# Patient Record
Sex: Female | Born: 1965
Health system: Southern US, Community
[De-identification: ages and names within clinical notes are randomized; demographics above are authoritative.]

## PROBLEM LIST (undated history)

## (undated) DIAGNOSIS — R7989 Other specified abnormal findings of blood chemistry: Secondary | ICD-10-CM

## (undated) DIAGNOSIS — F419 Anxiety disorder, unspecified: Secondary | ICD-10-CM

## (undated) DIAGNOSIS — E785 Hyperlipidemia, unspecified: Secondary | ICD-10-CM

## (undated) HISTORY — DX: Other specified abnormal findings of blood chemistry: R79.89

## (undated) HISTORY — DX: Anxiety disorder, unspecified: F41.9

## (undated) HISTORY — PX: BREAST ENHANCEMENT SURGERY: SHX7

## (undated) HISTORY — DX: Hyperlipidemia, unspecified: E78.5

---

## 2002-12-31 ENCOUNTER — Other Ambulatory Visit: Admission: RE | Admit: 2002-12-31 | Discharge: 2002-12-31 | Payer: Self-pay | Admitting: Obstetrics and Gynecology

## 2004-02-08 ENCOUNTER — Other Ambulatory Visit: Admission: RE | Admit: 2004-02-08 | Discharge: 2004-02-08 | Payer: Self-pay | Admitting: Obstetrics and Gynecology

## 2004-04-18 ENCOUNTER — Ambulatory Visit (HOSPITAL_BASED_OUTPATIENT_CLINIC_OR_DEPARTMENT_OTHER): Admission: RE | Admit: 2004-04-18 | Discharge: 2004-04-18 | Payer: Self-pay | Admitting: Urology

## 2004-04-18 ENCOUNTER — Ambulatory Visit (HOSPITAL_COMMUNITY): Admission: RE | Admit: 2004-04-18 | Discharge: 2004-04-18 | Payer: Self-pay | Admitting: Urology

## 2005-04-03 ENCOUNTER — Other Ambulatory Visit: Admission: RE | Admit: 2005-04-03 | Discharge: 2005-04-03 | Payer: Self-pay | Admitting: Obstetrics and Gynecology

## 2013-05-15 ENCOUNTER — Encounter: Payer: Self-pay | Admitting: Internal Medicine

## 2013-05-15 ENCOUNTER — Ambulatory Visit (INDEPENDENT_AMBULATORY_CARE_PROVIDER_SITE_OTHER): Payer: 59 | Admitting: Internal Medicine

## 2013-05-15 VITALS — BP 118/62 | HR 69 | Ht 62.0 in | Wt 118.0 lb

## 2013-05-15 DIAGNOSIS — G4700 Insomnia, unspecified: Secondary | ICD-10-CM

## 2013-05-15 DIAGNOSIS — F5104 Psychophysiologic insomnia: Secondary | ICD-10-CM

## 2013-05-15 MED ORDER — TEMAZEPAM 15 MG PO CAPS: 15.0000 mg | ORAL_CAPSULE | Freq: Every evening | ORAL | Status: DC | PRN

## 2013-05-15 NOTE — Progress Notes (Signed)
05/15/13- 17 yoF never smoker from [place] 24 years ago. Self referral-having trouble sleeping. Always difficulty initiating and maintaining sleep. Can't sleep comfortably with husband-sleeps better and her own bedroom. She moves around a lot. Occasional snore. Avoids all caffeine and naps. Gets home from work about 8 PM. Uses amitriptyline for bladder but afraid of dependence so cuts the pill in fractions. Frequent nocturia. Bedtime 11 PM until 6:30 AM with sleep latency 30-60 minutes. Waking 45 times during the night before up at 7 AM. Rarely uses Xanax. Works as a Sports administrator.  Prior to Admission medications   Medication Sig Start Date End Date Taking? Authorizing Provider  ALPRAZolam Prudy Feeler) 0.25 MG tablet Take 0.25 mg by mouth daily as needed for anxiety.   Yes Historical Provider, MD  amitriptyline (ELAVIL) 25 MG tablet Take 25 mg by mouth at bedtime.   Yes Historical Provider, MD  temazepam (RESTORIL) 15 MG capsule Take 1 capsule (15 mg total) by mouth at bedtime as needed for sleep. 05/15/13   Waymon Budge, MD   History reviewed. No pertinent past medical history. Past Surgical History  Procedure Laterality Date  . Breast enhancement surgery Bilateral    Family History  Problem Relation Age of Onset  . Anemia Father    History   Social History  . Marital Status: Married    Spouse Name: N/A    Number of Children: 2  . Years of Education: N/A   Occupational History  . tailor seamtress    Social History Main Topics  . Smoking status: Never Smoker   . Smokeless tobacco: Not on file  . Alcohol Use: No  . Drug Use: No  . Sexual Activity: Not on file   Other Topics Concern  . Not on file   Social History Narrative  . No narrative on file   ROS-see HPI Constitutional:   No-   weight loss, night sweats, fevers, chills, fatigue, lassitude. HEENT:   No-  headaches, difficulty swallowing, tooth/dental problems, sore throat,       No-  sneezing, itching, ear ache,  nasal congestion, post nasal drip,  CV:  No-   chest pain, orthopnea, PND, swelling in lower extremities, anasarca,  dizziness, palpitations Resp: No-   shortness of breath with exertion or at rest.              No-   productive cough,  No non-productive cough,  No- coughing up of blood.              No-   change in color of mucus.  No- wheezing.   Skin: No-   rash or lesions. GI:  No-   heartburn, indigestion, abdominal pain, nausea, vomiting, diarrhea,                 change in bowel habits, loss of appetite GU: No-   dysuria, change in color of urine, no urgency or frequency.  No- flank pain. MS:  No-   joint pain or swelling.  No- decreased range of motion.  No- back pain. Neuro-     nothing unusual Psych:  No- change in mood or affect. No depression or anxiety.  No memory loss.  OBJ- Physical Exam General- Alert, Oriented, Affect-appropriate, Distress- none acute Skin- rash-none, lesions- none, excoriation- none Lymphadenopathy- none Head- atraumatic            Eyes- Gross vision intact, PERRLA, conjunctivae and secretions clear  Ears- Hearing, canals-normal            Nose- Clear, no-Septal dev, mucus, polyps, erosion, perforation             Throat- Mallampati II-III , mucosa clear , drainage- none, tonsils- atrophic Neck- flexible , trachea midline, no stridor , thyroid nl, carotid no bruit Chest - symmetrical excursion , unlabored           Heart/CV- RRR , no murmur , no gallop  , no rub, nl s1 s2                           - JVD- none , edema- none, stasis changes- none, varices- none           Lung- clear to P&A, wheeze- none, cough- none , dullness-none, rub- none           Chest wall-  Abd- tender-no, distended-no, bowel sounds-present, HSM- no Br/ Gen/ Rectal- Not done, not indicated Extrem- cyanosis- none, clubbing, none, atrophy- none, strength- nl Neuro- grossly intact to observation

## 2013-05-15 NOTE — Patient Instructions (Signed)
Try adding temazepam 1 at bedtime as needed for sleep  Continue amitriptyline at the dose prescribed by your bladder doctor  Please call back as needed

## 2013-05-31 ENCOUNTER — Encounter: Payer: Self-pay | Admitting: Internal Medicine

## 2013-05-31 DIAGNOSIS — F5104 Psychophysiologic insomnia: Secondary | ICD-10-CM | POA: Insufficient documentation

## 2013-05-31 NOTE — Assessment & Plan Note (Addendum)
Social stresses and urinary urgency causing nocturia both contribute to her insomnia. Plan-temazepam 15 mg. Also take amitriptyline at full prescribed dose. If bladder is no better she is to go back to her urologist

## 2013-07-03 ENCOUNTER — Encounter: Payer: Self-pay | Admitting: Gynecology

## 2013-07-06 ENCOUNTER — Ambulatory Visit (INDEPENDENT_AMBULATORY_CARE_PROVIDER_SITE_OTHER): Payer: 59 | Admitting: Internal Medicine

## 2013-07-06 ENCOUNTER — Encounter: Payer: Self-pay | Admitting: Internal Medicine

## 2013-07-06 VITALS — BP 112/66 | HR 95 | Ht 62.0 in | Wt 122.0 lb

## 2013-07-06 DIAGNOSIS — G4700 Insomnia, unspecified: Secondary | ICD-10-CM

## 2013-07-06 DIAGNOSIS — F5104 Psychophysiologic insomnia: Secondary | ICD-10-CM

## 2013-07-06 MED ORDER — TEMAZEPAM 15 MG PO CAPS: 15.0000 mg | ORAL_CAPSULE | Freq: Every evening | ORAL | Status: DC | PRN

## 2013-07-06 NOTE — Patient Instructions (Signed)
We can see you again in a year, unless you need help sooner. Please call as needed.  Script refilling temazepam for sleep. You can callus to refill this when it runs out, to last until your next appointment.

## 2013-07-06 NOTE — Progress Notes (Signed)
05/15/13- [age] yoF never smoker from [place] 24 years ago. Self referral-having trouble sleeping. Always difficulty initiating and maintaining sleep. Can't sleep comfortably with husband-sleeps better and her own bedroom. She moves around a lot. Occasional snore. Avoids all caffeine and naps. Gets home from work about 8 PM. Uses amitriptyline for bladder but afraid of dependence so cuts the pill in fractions. Frequent nocturia. Bedtime 11 PM until 6:30 AM with sleep latency 30-60 minutes. Waking 45 times during the night before up at 7 AM. Rarely uses Xanax. Works as a Sports administratortailor/seamstress.  07/06/13- [age] yoF never smoker from [place] 24 years ago. Self referral-having trouble sleeping. FOLLOWS FOR: Pt states that the Temazepam Rx helps when she takes it; she states is scared to take all the time. Using temazepam every 3 or 4 nights. Without it she only sleeps 2 or 3 hours. Amitriptyline now taking whole pill as prescribed. She admits "busy brain"-just can't let go of the issues of the day.  ROS-see HPI Constitutional:   No-   weight loss, night sweats, fevers, chills,+fatigue, lassitude. HEENT:   No-  headaches, difficulty swallowing, tooth/dental problems, sore throat,       No-  sneezing, itching, ear ache, nasal congestion, post nasal drip,  CV:  No-   chest pain, orthopnea, PND, swelling in lower extremities, anasarca,  dizziness, palpitations Resp: No-   shortness of breath with exertion or at rest.              No-   productive cough,  No non-productive cough,  No- coughing up of blood.              No-   change in color of mucus.  No- wheezing.   Skin: No-   rash or lesions. GI:  No-   heartburn, indigestion, abdominal pain, nausea, vomiting,  GU:  MS:  No-   joint pain or swelling.   Neuro-     nothing unusual Psych:  No- change in mood or affect. No depression or anxiety.  No memory loss.  OBJ- Physical Exam General- Alert, Oriented, Affect-appropriate, Distress- none acute Skin-  rash-none, lesions- none, excoriation- none Lymphadenopathy- none Head- atraumatic            Eyes- Gross vision intact, PERRLA, conjunctivae and secretions clear            Ears- Hearing, canals-normal            Nose- Clear, no-Septal dev, mucus, polyps, erosion, perforation             Throat- Mallampati II-III , mucosa clear , drainage- none, tonsils- atrophic Neck- flexible , trachea midline, no stridor , thyroid nl, carotid no bruit Chest - symmetrical excursion , unlabored           Heart/CV- RRR , no murmur , no gallop  , no rub, nl s1 s2                           - JVD- none , edema- none, stasis changes- none, varices- none           Lung- clear to P&A, wheeze- none, cough- none , dullness-none, rub- none           Chest wall-  Abd-  Br/ Gen/ Rectal- Not done, not indicated Extrem- cyanosis- none, clubbing, none, atrophy- none, strength- nl Neuro- grossly intact to observation

## 2013-07-29 NOTE — Assessment & Plan Note (Signed)
Nonspecific chronic insomnia. There is probably a stress component and maybe some depression but the pattern is long-standing. We have talked about the possibility of cognitive behavioral therapy. For now she is doing very well with temazepam and amitriptyline, which can be continued. Medication talked done

## 2013-08-07 ENCOUNTER — Encounter: Payer: Self-pay | Admitting: Gynecology

## 2013-08-07 ENCOUNTER — Ambulatory Visit (INDEPENDENT_AMBULATORY_CARE_PROVIDER_SITE_OTHER): Payer: 59 | Admitting: Gynecology

## 2013-08-07 VITALS — BP 120/82 | HR 83 | Resp 14 | Ht 61.75 in | Wt 121.0 lb

## 2013-08-07 DIAGNOSIS — N852 Hypertrophy of uterus: Secondary | ICD-10-CM

## 2013-08-07 DIAGNOSIS — Z0000 Encounter for general adult medical examination without abnormal findings: Secondary | ICD-10-CM

## 2013-08-07 DIAGNOSIS — Z01419 Encounter for gynecological examination (general) (routine) without abnormal findings: Secondary | ICD-10-CM

## 2013-08-07 DIAGNOSIS — Z124 Encounter for screening for malignant neoplasm of cervix: Secondary | ICD-10-CM

## 2013-08-07 LAB — POCT URINALYSIS DIPSTICK
RBC UA: 2
Urobilinogen, UA: NEGATIVE
pH, UA: 5

## 2013-08-07 NOTE — Progress Notes (Signed)
48 y.o. Married Asian female   G2P2002 here for annual exam. Pt is currently sexually active.  Pt reports that her cycles were irregluar but now have been normal for the last few months.  Pt is having issues with her bladder, she will get up multiple times at night, also seems to be affected by food-spicy and acidic.  Was evaluated by urology and was given Elmiron. Pt is on elavil for 6-7y, every night and now has some memory issues.  Pt was evaluated for sleep she has not been able to come off the medications.  Pt denies fibroids.  No hot flashes, night sweats.  No dyspareunia  Patient's last menstrual period was 07/29/2013.          Sexually active: yes  The current method of family planning is none.    Exercising: yes  pretty active at work Last pap: 4-5 years Alcohol: no  Tobacco: no BSE: yes Mammogram: 3 years ago Normal  Labs drawn ; Urine: Leuks 2, Blood 2     Health Maintenance  Topic Date Due  . Pap Smear  12/18/1983  . Tetanus/tdap  12/17/1984  . Influenza Vaccine  07/06/2014 (Originally 01/23/2013)    Family History  Problem Relation Age of Onset  . Anemia Father     Patient Active Problem List   Diagnosis Date Noted  . Chronic insomnia 05/31/2013    Past Medical History  Diagnosis Date  . Anxiety     Past Surgical History  Procedure Laterality Date  . Breast enhancement surgery Bilateral 10 years ago    Breast Lift    Allergies: Review of patient's allergies indicates no known allergies.  Current Outpatient Prescriptions  Medication Sig Dispense Refill  . ALPRAZolam (XANAX) 0.25 MG tablet Take 0.25 mg by mouth daily as needed for anxiety.      Marland Kitchen amitriptyline (ELAVIL) 25 MG tablet Take 25 mg by mouth at bedtime.      . temazepam (RESTORIL) 15 MG capsule Take 1 capsule (15 mg total) by mouth at bedtime as needed for sleep.  30 capsule  5   No current facility-administered medications for this visit.    ROS: Pertinent items are noted in HPI.  Exam:     BP 120/82  Pulse 83  Resp 14  Ht 5' 1.75" (1.568 m)  Wt 121 lb (54.885 kg)  BMI 22.32 kg/m2  LMP 07/29/2013 Weight change: @WEIGHTCHANGE @ Last 3 height recordings:  Ht Readings from Last 3 Encounters:  08/07/13 5' 1.75" (1.568 m)  07/06/13 5\' 2"  (1.575 m)  05/15/13 5\' 2"  (1.575 m)   General appearance: alert, cooperative and appears stated age Head: Normocephalic, without obvious abnormality, atraumatic Neck: no adenopathy, no carotid bruit, no JVD, supple, symmetrical, trachea midline and thyroid not enlarged, symmetric, no tenderness/mass/nodules Lungs: clear to auscultation bilaterally Breasts: normal appearance, no masses or tenderness Heart: regular rate and rhythm, S1, S2 normal, no murmur, click, rub or gallop Abdomen: soft, non-tender; bowel sounds normal; no masses,  no organomegaly Extremities: extremities normal, atraumatic, no cyanosis or edema Skin: Skin color, texture, turgor normal. No rashes or lesions Lymph nodes: Cervical, supraclavicular, and axillary nodes normal. no inguinal nodes palpated Neurologic: Grossly normal   Pelvic: External genitalia:  no lesions              Urethra: normal appearing urethra with no masses, tenderness or lesions              Bartholins and Skenes: normal  Vagina: normal appearing vagina with normal color and discharge, no lesions              Cervix: normal appearance and friable              Pap taken: yes        Bimanual Exam:  Uterus:  enlarged to 10 week's size, irregular                                      Adnexa:    no masses                                      Rectovaginal: Confirms                                      Anus:  normal sphincter tone, no lesions  A: well woman Contraceptive management Enlarged uterus     P: mammogram pap smear with HrHPV PUS to evaluate uterus and adnexa counseled on breast self exam, mammography screening, adequate intake of calcium and vitamin D, diet and  exercise return annually or prn   An After Visit Summary was printed and given to the patient.

## 2013-08-07 NOTE — Patient Instructions (Signed)

## 2013-08-12 ENCOUNTER — Telehealth: Payer: Self-pay | Admitting: Gynecology

## 2013-08-12 NOTE — Telephone Encounter (Signed)
Left message to call back. Need to go over benefits and schedule PUS °

## 2013-08-13 NOTE — Telephone Encounter (Signed)
Patients husband called back stating that his wife would like to have PUS performed at [facility] [place].

## 2013-08-13 NOTE — Telephone Encounter (Signed)
Returned call to patient. She asked that I call her husband, Gershon Mussel, to go over benefit information. I called Tom at [phone] advising that $400 calendar year deductible has not been met per their insurance company. Also advised that patient liability for the PUS will be $302.99. Gershon Mussel will contact the insurance company and call back.

## 2013-08-13 NOTE — Telephone Encounter (Signed)
Patient returning Sabrina's call. °

## 2013-08-13 NOTE — Telephone Encounter (Signed)
Spouse called back. Agreeable with [facility] for PUS. Patient will call back to schedule.

## 2013-08-14 LAB — IPS PAP TEST WITH HPV

## 2013-08-25 ENCOUNTER — Telehealth: Payer: Self-pay | Admitting: Gynecology

## 2013-08-25 NOTE — Telephone Encounter (Signed)
Patient is scheduled to have this ultrasound at [facility] 03.06.2015 @ 9am. Patient aware. Patient also aware to that she needs f/u appt w/ Dr Farrel GobbleLathrop. Patient is going to call [facility]. She may change her appointment date and time.

## 2013-08-25 NOTE — Telephone Encounter (Signed)
Message copied by Vangie BickerFRANKLIN, SABRINA S on Tue Aug 25, 2013 11:36 AM ------      Message from: Douglass RiversLATHROP, TRACY      Created: Fri Aug 14, 2013  2:40 PM       [facility] she will need a f/u appt to discuss about 1w afterwards      TL      ----- Message -----         From: Vangie BickerSabrina S [place]         Sent: 08/13/2013  12:19 PM           To: Armen PickupSarah S Yeakley, RN, Bennye Almracy H Lathrop, MD            Dr. Farrel GobbleLathrop,            Is it okay that the patient have her ultrasound performed at [facility] in [place]. Per patients spouse, they cannot scheduled on a Tuesday to come here. Patient needs to schedule on a Monday or a Friday.            Thanks,      Martie LeeSabrina       ------

## 2013-08-31 ENCOUNTER — Telehealth: Payer: Self-pay | Admitting: Gynecology

## 2013-08-31 NOTE — Telephone Encounter (Signed)
Patient has some questions for nurse about scheduling an appointment since she has had an ultrasound. She is not sure when exactly she should come in for a recheck.

## 2013-08-31 NOTE — Telephone Encounter (Signed)
Patient has order for PUS to check for fibroids since enlarged uterus on pelvic exam. Can you check on this?

## 2013-09-01 NOTE — Telephone Encounter (Signed)
Kennon RoundsSally, This is the patient that had PUS at [facility]. She was advised that she would need follow up appointment after she had that ultrasound. Should she be scheduled 1 week post PUS? If so, I will call and schedule her.  Thanks, Martie LeeSabrina

## 2013-09-02 NOTE — Telephone Encounter (Signed)
Routing to Sally. 

## 2013-09-02 NOTE — Telephone Encounter (Signed)
Yes, 1 week should be fine.  Thank you for scheduling. Can route to Dr Farrel GobbleLathrop and close encounter once scheduled.

## 2013-09-04 NOTE — Telephone Encounter (Signed)
Called patient. Scheduled visit

## 2013-09-14 ENCOUNTER — Ambulatory Visit (INDEPENDENT_AMBULATORY_CARE_PROVIDER_SITE_OTHER): Payer: 59 | Admitting: Gynecology

## 2013-09-14 VITALS — BP 110/80 | HR 76 | Resp 14 | Ht 61.75 in | Wt 121.0 lb

## 2013-09-14 DIAGNOSIS — N852 Hypertrophy of uterus: Secondary | ICD-10-CM

## 2013-09-15 ENCOUNTER — Encounter: Payer: Self-pay | Admitting: Gynecology

## 2013-09-15 NOTE — Progress Notes (Signed)
Pt presents to discuss her u/s that was done for enlarged uterus noted at [facility].  The report was reviewed, images not available-uterus is upper normal at 8.6x5.2x6.4cm but no fibroids were noted.  In addition, her ovaries were reported as normal, EMS 7mm, no free fluid noted. Pt assured.  Most likely enlargement is due to adenomyosis.  Discussed impact, pt is not having any issues regarding bleeding or cramping.

## 2013-09-24 ENCOUNTER — Ambulatory Visit (INDEPENDENT_AMBULATORY_CARE_PROVIDER_SITE_OTHER): Payer: 59 | Admitting: Internal Medicine

## 2013-09-24 ENCOUNTER — Encounter: Payer: Self-pay | Admitting: Internal Medicine

## 2013-09-24 ENCOUNTER — Ambulatory Visit: Payer: 59 | Admitting: Internal Medicine

## 2013-09-24 VITALS — BP 112/70 | HR 95 | Ht 62.0 in | Wt 120.0 lb

## 2013-09-24 DIAGNOSIS — G4700 Insomnia, unspecified: Secondary | ICD-10-CM

## 2013-09-24 DIAGNOSIS — F5104 Psychophysiologic insomnia: Secondary | ICD-10-CM

## 2013-09-24 MED ORDER — TEMAZEPAM 7.5 MG PO CAPS: ORAL_CAPSULE | ORAL | Status: DC

## 2013-09-24 MED ORDER — AMITRIPTYLINE HCL 25 MG PO TABS: 25.0000 mg | ORAL_TABLET | Freq: Every day | ORAL | Status: DC

## 2013-09-24 NOTE — Patient Instructions (Signed)
Refill script for amitrptylline as before  Script for temazepam changed from 15 mg to 7.5 mg. Now you can take 1-3 (7-5 mg- 22.5 mg) at bedtime for sleep as needed, depending on how much you think you need.

## 2013-09-24 NOTE — Progress Notes (Signed)
05/15/13- [age] yoF never smoker from [place] 24 years ago. Self referral-having trouble sleeping. Always difficulty initiating and maintaining sleep. Can't sleep comfortably with husband-sleeps better and her own bedroom. She moves around a lot. Occasional snore. Avoids all caffeine and naps. Gets home from work about 8 PM. Uses amitriptyline for bladder but afraid of dependence so cuts the pill in fractions. Frequent nocturia. Bedtime 11 PM until 6:30 AM with sleep latency 30-60 minutes. Waking 45 times during the night before up at 7 AM. Rarely uses Xanax. Works as a Sports administratortailor/seamstress.  07/06/13- [age] yoF never smoker from [place] 24 years ago. Self referral-having trouble sleeping. FOLLOWS FOR: Pt states that the Temazepam Rx helps when she takes it; she states is scared to take all the time. Using temazepam every 3 or 4 nights. Without it she only sleeps 2 or 3 hours. Amitriptyline now taking whole pill as prescribed. She admits "busy brain"-just can't let go of the issues of the day.  09/24/13-47 yoF never smoker from [place] 24 years ago. Self referral-having trouble sleeping. FOLLOWS FOR:  States medicines work well and she is sleeping, but wanted to come off of them but when she doesn't sleep Occasionally tries to skip temazepam but still needs amitriptyline. To sleep well she needs both. Usually she is okay but still times "busy brain" blamed on job stress.   ROS-see HPI Constitutional:   No-   weight loss, night sweats, fevers, chills,+fatigue, lassitude. HEENT:   No-  headaches, difficulty swallowing, tooth/dental problems, sore throat,       No-  sneezing, itching, ear ache, nasal congestion, post nasal drip,  CV:  No-   chest pain, orthopnea, PND, swelling in lower extremities, anasarca,  dizziness, palpitations Resp: No-   shortness of breath with exertion or at rest.              No-   productive cough,  No non-productive cough,  No- coughing up of blood.              No-   change in  color of mucus.  No- wheezing.   Skin: No-   rash or lesions. GI:  No-   heartburn, indigestion, abdominal pain, nausea, vomiting,  GU:  MS:  No-   joint pain or swelling.   Neuro-     nothing unusual Psych:  No- change in mood or affect. No depression + anxiety.  No memory loss.  OBJ- Physical Exam General- Alert, Oriented, Affect-appropriate, Distress- none acute, looks well Skin- rash-none, lesions- none, excoriation- none Lymphadenopathy- none Head- atraumatic            Eyes- Gross vision intact, PERRLA, conjunctivae and secretions clear            Ears- Hearing, canals-normal            Nose- Clear, no-Septal dev, mucus, polyps, erosion, perforation             Throat- Mallampati II-III , mucosa clear , drainage- none, tonsils- atrophic Neck- flexible , trachea midline, no stridor , thyroid nl, carotid no bruit Chest - symmetrical excursion , unlabored           Heart/CV- RRR , no murmur , no gallop  , no rub, nl s1 s2                           - JVD- none , edema- none, stasis changes- none, varices- none  Lung- clear to P&A, wheeze- none, cough- none , dullness-none, rub- none           Chest wall-  Abd-  Br/ Gen/ Rectal- Not done, not indicated Extrem- cyanosis- none, clubbing, none, atrophy- none, strength- nl Neuro- grossly intact to observation

## 2013-10-24 NOTE — Assessment & Plan Note (Signed)
Does well enough combining amitriptyline with temazepam. 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 We discussed medicines at options again including ability to refer for cognitive behavioral therapy.

## 2013-12-18 ENCOUNTER — Encounter: Payer: Self-pay | Admitting: Gynecology

## 2014-03-29 ENCOUNTER — Encounter (INDEPENDENT_AMBULATORY_CARE_PROVIDER_SITE_OTHER): Payer: Self-pay

## 2014-03-29 ENCOUNTER — Ambulatory Visit (INDEPENDENT_AMBULATORY_CARE_PROVIDER_SITE_OTHER): Payer: 59 | Admitting: Internal Medicine

## 2014-03-29 ENCOUNTER — Encounter: Payer: Self-pay | Admitting: Internal Medicine

## 2014-03-29 VITALS — BP 100/64 | HR 79 | Ht 62.5 in | Wt 126.6 lb

## 2014-03-29 DIAGNOSIS — F5104 Psychophysiologic insomnia: Secondary | ICD-10-CM

## 2014-03-29 MED ORDER — TEMAZEPAM 7.5 MG PO CAPS: ORAL_CAPSULE | ORAL | Status: DC

## 2014-03-29 NOTE — Patient Instructions (Signed)
Refill script for temazepam to use if needed for sleep  Consider trying an over the counter multivitamin instead of the expensive mail-order brand you are using:      Centrum Silver or One A Day brands would be good

## 2014-03-29 NOTE — Progress Notes (Signed)
05/15/13- [age] yoF never smoker from [place] 24 years ago. Self referral-having trouble sleeping. Always difficulty initiating and maintaining sleep. Can't sleep comfortably with husband-sleeps better and her own bedroom. She moves around a lot. Occasional snore. Avoids all caffeine and naps. Gets home from work about 8 PM. Uses amitriptyline for bladder but afraid of dependence so cuts the pill in fractions. Frequent nocturia. Bedtime 11 PM until 6:30 AM with sleep latency 30-60 minutes. Waking 45 times during the night before up at 7 AM. Rarely uses Xanax. Works as a Sports administratortailor/seamstress.  07/06/13- [age] yoF never smoker from [place] 24 years ago. Self referral-having trouble sleeping. FOLLOWS FOR: Pt states that the Temazepam Rx helps when she takes it; she states is scared to take all the time. Using temazepam every 3 or 4 nights. Without it she only sleeps 2 or 3 hours. Amitriptyline now taking whole pill as prescribed. She admits "busy brain"-just can't let go of the issues of the day.  09/24/13-47 yoF never smoker from [place] 24 years ago. Self referral-having trouble sleeping. FOLLOWS FOR:  States medicines work well and she is sleeping, but wanted to come off of them but when she doesn't sleep Occasionally tries to skip temazepam but still needs amitriptyline. To sleep well she needs both. Usually she is okay but still times "busy brain" blamed on job stress.  03/29/14- 48 yoF never smoker from [place] 24 years ago. Self referral-having trouble sleeping. FOLLOW FOR:  Insomnia.  Sleeping has gotten a little better.  Sleeping 4-5 hours nightly.      ROS-see HPI Constitutional:   No-   weight loss, night sweats, fevers, chills,+fatigue, lassitude. HEENT:   No-  headaches, difficulty swallowing, tooth/dental problems, sore throat,       No-  sneezing, itching, ear ache, nasal congestion, post nasal drip,  CV:  No-   chest pain, orthopnea, PND, swelling in lower extremities, anasarca,  dizziness,  palpitations Resp: No-   shortness of breath with exertion or at rest.              No-   productive cough,  No non-productive cough,  No- coughing up of blood.              No-   change in color of mucus.  No- wheezing.   Skin: No-   rash or lesions. GI:  No-   heartburn, indigestion, abdominal pain, nausea, vomiting,  GU:  MS:  No-   joint pain or swelling.   Neuro-     nothing unusual Psych:  No- change in mood or affect. No depression + anxiety.  No memory loss.  OBJ- Physical Exam General- Alert, Oriented, Affect-appropriate, Distress- none acute, looks well Skin- rash-none, lesions- none, excoriation- none Lymphadenopathy- none Head- atraumatic            Eyes- Gross vision intact, PERRLA, conjunctivae and secretions clear            Ears- Hearing, canals-normal            Nose- Clear, no-Septal dev, mucus, polyps, erosion, perforation             Throat- Mallampati II-III , mucosa clear , drainage- none, tonsils- atrophic Neck- flexible , trachea midline, no stridor , thyroid nl, carotid no bruit Chest - symmetrical excursion , unlabored           Heart/CV- RRR , no murmur , no gallop  , no rub, nl s1 s2                           -  JVD- none , edema- none, stasis changes- none, varices- none           Lung- clear to P&A, wheeze- none, cough- none , dullness-none, rub- none           Chest wall-  Abd-  Br/ Gen/ Rectal- Not done, not indicated Extrem- cyanosis- none, clubbing, none, atrophy- none, strength- nl Neuro- grossly intact to observation

## 2014-04-26 ENCOUNTER — Encounter: Payer: Self-pay | Admitting: Internal Medicine

## 2014-06-11 ENCOUNTER — Encounter (HOSPITAL_COMMUNITY): Payer: Self-pay | Admitting: Emergency Medicine

## 2014-06-11 ENCOUNTER — Emergency Department (HOSPITAL_COMMUNITY)
Admission: EM | Admit: 2014-06-11 | Discharge: 2014-06-12 | Disposition: A | Discharge status: Home / Self Care | Payer: 59 | Attending: Emergency Medicine | Admitting: Emergency Medicine

## 2014-06-11 DIAGNOSIS — F419 Anxiety disorder, unspecified: Secondary | ICD-10-CM | POA: Diagnosis not present

## 2014-06-11 DIAGNOSIS — R0789 Other chest pain: Secondary | ICD-10-CM | POA: Diagnosis not present

## 2014-06-11 DIAGNOSIS — R5383 Other fatigue: Secondary | ICD-10-CM

## 2014-06-11 DIAGNOSIS — Z79899 Other long term (current) drug therapy: Secondary | ICD-10-CM | POA: Insufficient documentation

## 2014-06-11 DIAGNOSIS — R112 Nausea with vomiting, unspecified: Secondary | ICD-10-CM | POA: Diagnosis not present

## 2014-06-11 DIAGNOSIS — Z3202 Encounter for pregnancy test, result negative: Secondary | ICD-10-CM | POA: Insufficient documentation

## 2014-06-11 DIAGNOSIS — R42 Dizziness and giddiness: Secondary | ICD-10-CM

## 2014-06-11 LAB — COMPREHENSIVE METABOLIC PANEL
ALT: 6 U/L (ref 0–35)
AST: 19 U/L (ref 0–37)
Albumin: 3.9 g/dL (ref 3.5–5.2)
Alkaline Phosphatase: 53 U/L (ref 39–117)
Anion gap: 14 (ref 5–15)
BUN: 16 mg/dL (ref 6–23)
CALCIUM: 9 mg/dL (ref 8.4–10.5)
CO2: 23 meq/L (ref 19–32)
CREATININE: 0.7 mg/dL (ref 0.50–1.10)
Chloride: 99 mEq/L (ref 96–112)
GLUCOSE: 144 mg/dL — AB (ref 70–99)
Potassium: 3.5 mEq/L — ABNORMAL LOW (ref 3.7–5.3)
Sodium: 136 mEq/L — ABNORMAL LOW (ref 137–147)
Total Bilirubin: <0.2 mg/dL — ABNORMAL LOW (ref 0.3–1.2)
Total Protein: 7.5 g/dL (ref 6.0–8.3)

## 2014-06-11 LAB — CBC WITH DIFFERENTIAL/PLATELET
Basophils Absolute: 0 10*3/uL (ref 0.0–0.1)
Basophils Relative: 0 % (ref 0–1)
EOS PCT: 1 % (ref 0–5)
Eosinophils Absolute: 0.1 10*3/uL (ref 0.0–0.7)
HEMATOCRIT: 35 % — AB (ref 36.0–46.0)
Hemoglobin: 11.7 g/dL — ABNORMAL LOW (ref 12.0–15.0)
LYMPHS ABS: 2.3 10*3/uL (ref 0.7–4.0)
Lymphocytes Relative: 21 % (ref 12–46)
MCH: 30.9 pg (ref 26.0–34.0)
MCHC: 33.4 g/dL (ref 30.0–36.0)
MCV: 92.3 fL (ref 78.0–100.0)
MONO ABS: 0.6 10*3/uL (ref 0.1–1.0)
Monocytes Relative: 6 % (ref 3–12)
Neutro Abs: 8 10*3/uL — ABNORMAL HIGH (ref 1.7–7.7)
Neutrophils Relative %: 72 % (ref 43–77)
Platelets: 315 10*3/uL (ref 150–400)
RBC: 3.79 MIL/uL — AB (ref 3.87–5.11)
RDW: 13.1 % (ref 11.5–15.5)
WBC: 11 10*3/uL — AB (ref 4.0–10.5)

## 2014-06-11 LAB — I-STAT TROPONIN, ED: Troponin i, poc: 0 ng/mL (ref 0.00–0.08)

## 2014-06-11 LAB — LIPASE, BLOOD: LIPASE: 38 U/L (ref 11–59)

## 2014-06-11 MED ORDER — SODIUM CHLORIDE 0.9 % IV BOLUS (SEPSIS)
1000.0000 mL | Freq: Once | INTRAVENOUS | Status: AC
  Administered 2014-06-12: 1000 mL via INTRAVENOUS

## 2014-06-11 MED ORDER — POTASSIUM CHLORIDE CRYS ER 20 MEQ PO TBCR
40.0000 meq | EXTENDED_RELEASE_TABLET | Freq: Once | ORAL | Status: AC
  Administered 2014-06-12: 40 meq via ORAL
  Filled 2014-06-11: qty 2

## 2014-06-11 NOTE — ED Notes (Signed)
Primary language [place]etnamese, 4 mg zofran on board

## 2014-06-11 NOTE — ED Provider Notes (Signed)
CSN: [id number]     Arrival date & time 06/11/14  2230 History  This chart was scribed for Ward GivensIva L Brycelynn Stampley, MD by Richarda Overlieichard Holland, ED Scribe. This patient was seen in room A09C/A09C and the patient's care was started 11:40 PM.      Chief Complaint  Patient presents with  . Emesis  . Anxiety   HPI HPI Comments: Erika Scott is a 48 y.o. female who presents to the Emergency Department complaining of a dizziness episode that occurred around 8PM tonight. 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 Pt states she felt hungry when she got home and ate some chicken pot pie at 7:30PM. She says she then laid down on the couch at 8PM and wanted to sleep. Her son states that when he got home around 8PM she seemed disoriented and pt states her head was "spinning around and around" when she stood up. She states she had trouble breathing and had a heaviness in her chest. Pt reports associated nausea and vomiting during the episode.  She states her dizziness has been improving. She reports she had blurred vision when she was dizzy but has since improved. Pt reports her feet were numb a few hours ago and she could not move them but this has improved as well. Pt started vesicare medication 2 weeks ago and states she has always felt different after taking it and felt different tonight about 1 hour after taking the medication. Pt is taking elavil and restoril but has not taken any of her sleep medications tonight. She says she started feeling better about an hour ago. Pt reports no similar prior episodes but says she works sitting down and has felt dizzy when standing up after sitting down for long periods of time recently. She denies diaphoreses.  PCP Dr Rinaldo CloudP Jones  Past Medical History  Diagnosis Date  . Anxiety    Past Surgical History  Procedure Laterality Date  . Breast enhancement surgery Bilateral 10 years ago    Breast Lift   Family History  Problem  Relation Age of Onset  . Anemia Father    History  Substance Use Topics  . Smoking status: Never Smoker   . Smokeless tobacco: Not on file  . Alcohol Use: No   Lives at home Lives with spouse Has her own alterations business  OB History    Gravida Para Term Preterm AB TAB SAB Ectopic Multiple Living   3 2 2  1 1    2      Review of Systems  Respiratory: Positive for chest tightness and shortness of breath.   Gastrointestinal: Positive for nausea and vomiting.  Neurological: Positive for dizziness.  Psychiatric/Behavioral: The patient is nervous/anxious.   All other systems reviewed and are negative.   Allergies  Review of patient's allergies indicates no known allergies.  Home Medications   Prior to Admission medications   Medication Sig Start Date End Date Taking? Authorizing Provider  amitriptyline (ELAVIL) 25 MG tablet Take 1 tablet (25 mg total) by mouth at bedtime. 09/24/13  Yes Waymon Budgelinton D Young, MD  temazepam (RESTORIL) 7.5 MG capsule 1-3 for sleep as needed Patient taking differently: Take 7.5 mg by mouth at bedtime as needed for sleep.  03/29/14  Yes Waymon Budgelinton D Young, MD  bupivacaine (MARCAINE) 0.5 % injection 50 mLs by Infiltration route once. Twice weekly    Historical Provider, MD  OVER THE COUNTER  MEDICATION Take by mouth 2 (two) times daily. [facility] Clinical Support Morning/ Evening Formula    Historical Provider, MD   BP 119/74 mmHg  Pulse 86  Temp(Src) 97.6 F (36.4 C)  Resp 21  SpO2 100%  Vital signs normal    Physical Exam  Constitutional: She is oriented to person, place, and time. She appears well-developed and well-nourished.  Non-toxic appearance. She does not appear ill. No distress.  HENT:  Head: Normocephalic and atraumatic.  Right Ear: External ear normal.  Left Ear: External ear normal.  Nose: Nose normal. No mucosal edema or rhinorrhea.  Mouth/Throat: Oropharynx is clear and moist and mucous membranes are normal. No dental abscesses or  uvula swelling.  Tongue was dry.   Eyes: Conjunctivae and EOM are normal. Pupils are equal, round, and reactive to light.  Neck: Normal range of motion and full passive range of motion without pain. Neck supple.  Cardiovascular: Normal rate, regular rhythm and normal heart sounds.  Exam reveals no gallop and no friction rub.   No murmur heard. Pulmonary/Chest: Effort normal and breath sounds normal. No respiratory distress. She has no wheezes. She has no rhonchi. She has no rales. She exhibits no tenderness and no crepitus.  Abdominal: Soft. Normal appearance and bowel sounds are normal. She exhibits no distension. There is no tenderness. There is no rebound and no guarding.  Musculoskeletal: Normal range of motion. She exhibits no edema or tenderness.  Moves all extremities well.   Neurological: She is alert and oriented to person, place, and time. She has normal strength. No cranial nerve deficit.  No pronator drift. No motor weakness.     Skin: Skin is warm, dry and intact. No rash noted. No erythema. No pallor.  Psychiatric: She has a normal mood and affect. Her speech is normal and behavior is normal. Her mood appears not anxious.  Nursing note and vitals reviewed.   ED Course  Procedures   DIAGNOSTIC STUDIES: Oxygen Saturation is 100% on RA, normal by my interpretation.    COORDINATION OF CARE: 12:00 AM Discussed treatment plan with pt at bedside and pt agreed to plan.  1:37 AM Pt states she is feeling better. Says she has not urinated yet but feels like she could at this time.   2:50 AM Pt says she is feeling better. Said she felt fine walking to the bathroom and says she is ready to go home.  States she feels back to normal. States she had a lot of urine output.     Orthostatic Vital Signs Orthostatic Lying  - BP- Lying: 106/74 mmHg ; Pulse- Lying: 74  Orthostatic Sitting - BP- Sitting: 106/64 mmHg ; Pulse- Sitting: 81  Orthostatic Standing at 0 minutes - BP- Standing at 0  minutes: 115/65 mmHg ; Pulse- Standing at 0 minutes: 78     Labs Review Results for orders placed or performed during the hospital encounter of 06/11/14  CBC with Differential  Result Value Ref Range   WBC 11.0 (H) 4.0 - 10.5 K/uL   RBC 3.79 (L) 3.87 - 5.11 MIL/uL   Hemoglobin 11.7 (L) 12.0 - 15.0 g/dL   HCT 16.135.0 (L) 09.636.0 - 04.546.0 %   MCV 92.3 78.0 - 100.0 fL   MCH 30.9 26.0 - 34.0 pg   MCHC 33.4 30.0 - 36.0 g/dL   RDW 40.913.1 81.111.5 - 91.415.5 %   Platelets 315 150 - 400 K/uL   Neutrophils Relative % 72 43 - 77 %   Neutro Abs  8.0 (H) 1.7 - 7.7 K/uL   Lymphocytes Relative 21 12 - 46 %   Lymphs Abs 2.3 0.7 - 4.0 K/uL   Monocytes Relative 6 3 - 12 %   Monocytes Absolute 0.6 0.1 - 1.0 K/uL   Eosinophils Relative 1 0 - 5 %   Eosinophils Absolute 0.1 0.0 - 0.7 K/uL   Basophils Relative 0 0 - 1 %   Basophils Absolute 0.0 0.0 - 0.1 K/uL  Comprehensive metabolic panel  Result Value Ref Range   Sodium 136 (L) 137 - 147 mEq/L   Potassium 3.5 (L) 3.7 - 5.3 mEq/L   Chloride 99 96 - 112 mEq/L   CO2 23 19 - 32 mEq/L   Glucose, Bld 144 (H) 70 - 99 mg/dL   BUN 16 6 - 23 mg/dL   Creatinine, Ser 1.61 0.50 - 1.10 mg/dL   Calcium 9.0 8.4 - 09.6 mg/dL   Total Protein 7.5 6.0 - 8.3 g/dL   Albumin 3.9 3.5 - 5.2 g/dL   AST 19 0 - 37 U/L   ALT 6 0 - 35 U/L   Alkaline Phosphatase 53 39 - 117 U/L   Total Bilirubin <0.2 (L) 0.3 - 1.2 mg/dL   GFR calc non Af Amer >90 >90 mL/min   GFR calc Af Amer >90 >90 mL/min   Anion gap 14 5 - 15  Lipase, blood  Result Value Ref Range   Lipase 38 11 - 59 U/L  Urinalysis, Routine w reflex microscopic  Result Value Ref Range   Color, Urine YELLOW YELLOW   APPearance CLEAR CLEAR   Specific Gravity, Urine 1.021 1.005 - 1.030   pH 8.5 (H) 5.0 - 8.0   Glucose, UA NEGATIVE NEGATIVE mg/dL   Hgb urine dipstick NEGATIVE NEGATIVE   Bilirubin Urine NEGATIVE NEGATIVE   Ketones, ur 15 (A) NEGATIVE mg/dL   Protein, ur NEGATIVE NEGATIVE mg/dL   Urobilinogen, UA 0.2 0.0 - 1.0  mg/dL   Nitrite NEGATIVE NEGATIVE   Leukocytes, UA NEGATIVE NEGATIVE  Pregnancy, urine  Result Value Ref Range   Preg Test, Ur NEGATIVE NEGATIVE  Pro b natriuretic peptide (BNP)  Result Value Ref Range   Pro B Natriuretic peptide (BNP) 5.8 0 - 125 pg/mL  D-dimer, quantitative  Result Value Ref Range   D-Dimer, Quant <0.27 0.00 - 0.48 ug/mL-FEU  I-stat troponin, ED (only if pt is 48 y.o. or older & pain is above umbilicus) - do not order at [facility]  Result Value Ref Range   Troponin i, poc 0.00 0.00 - 0.08 ng/mL   Comment 3           Laboratory interpretation all normal except leukocytosis, mild anemia, mild hypokalemia     Imaging Review Dg Chest 2 View  06/12/2014   CLINICAL DATA:  Acute onset of shortness of breath, dizziness and vomiting for 1 hour. Initial encounter.  EXAM: CHEST  2 VIEW  COMPARISON:  None.  FINDINGS: The lungs are well-aerated. Minimal left basilar atelectasis is noted. There is no evidence of pleural effusion or pneumothorax.  The heart is normal in size; the mediastinal contour is within normal limits. No acute osseous abnormalities are seen.  IMPRESSION: Minimal left basilar atelectasis noted; lungs otherwise clear.   Electronically Signed   By: Roanna Raider M.D.   On: 06/12/2014 00:36     EKG Interpretation   Date/Time:  Friday June 11 2014 22:42:11 EST Ventricular Rate:  79 PR Interval:  131 QRS Duration: 114 QT Interval:  415 QTC Calculation: 476 R Axis:   75 Text Interpretation:  Sinus rhythm Incomplete right bundle branch block No  old tracing to compare Confirmed by Airis Barbee  MD-I, Idabell Picking ([id number]) on 06/11/2014  11:54:29 PM      MDM   Final diagnoses:  Other fatigue  Dizziness  Nausea and vomiting, vomiting of unspecified type   New Prescriptions   ONDANSETRON (ZOFRAN) 4 MG TABLET    Take 1 tablet (4 mg total) by mouth every 8 (eight) hours as needed for nausea or vomiting.    Plan discharge  Devoria Albe, MD, FACEP    I personally  performed the services described in this documentation, which was scribed in my presence. The recorded information has been reviewed and considered.  Devoria Albe, MD, FACEP      Ward Givens, MD 06/12/14 [phone]

## 2014-06-11 NOTE — ED Notes (Signed)
Pt arrives with c/o memory impairment, per family. EMS has seen conscious alert and oriented x4. N/V, heartburn. States she hasn't been getting much sleep, has not urinated in two days.

## 2014-06-11 NOTE — ED Notes (Signed)
EDP at bedside  

## 2014-06-12 ENCOUNTER — Emergency Department (HOSPITAL_COMMUNITY): Payer: 59

## 2014-06-12 LAB — URINALYSIS, ROUTINE W REFLEX MICROSCOPIC
BILIRUBIN URINE: NEGATIVE
Glucose, UA: NEGATIVE mg/dL
Hgb urine dipstick: NEGATIVE
Ketones, ur: 15 mg/dL — AB
Leukocytes, UA: NEGATIVE
Nitrite: NEGATIVE
PROTEIN: NEGATIVE mg/dL
SPECIFIC GRAVITY, URINE: 1.021 (ref 1.005–1.030)
UROBILINOGEN UA: 0.2 mg/dL (ref 0.0–1.0)
pH: 8.5 — ABNORMAL HIGH (ref 5.0–8.0)

## 2014-06-12 LAB — D-DIMER, QUANTITATIVE: D-Dimer, Quant: <0.27 ug/mL-FEU (ref 0.00–0.48)

## 2014-06-12 LAB — PRO B NATRIURETIC PEPTIDE: PRO B NATRI PEPTIDE: 5.8 pg/mL (ref 0–125)

## 2014-06-12 LAB — PREGNANCY, URINE: Preg Test, Ur: NEGATIVE

## 2014-06-12 MED ORDER — ONDANSETRON HCL 4 MG PO TABS: 4.0000 mg | ORAL_TABLET | Freq: Three times a day (TID) | ORAL | Status: DC | PRN

## 2014-06-12 NOTE — Discharge Instructions (Signed)
Drink plenty of fluids. Try to get more rest and eat a regular diet. Use the zofran for nausea or vomiting if needed. Recheck if you feel worse again.

## 2014-08-09 ENCOUNTER — Ambulatory Visit: Payer: 59 | Admitting: Gynecology

## 2014-08-11 ENCOUNTER — Encounter: Payer: Self-pay | Admitting: Certified Nurse Midwife

## 2014-08-11 ENCOUNTER — Ambulatory Visit: Payer: 59 | Admitting: Gynecology

## 2014-08-11 ENCOUNTER — Ambulatory Visit (INDEPENDENT_AMBULATORY_CARE_PROVIDER_SITE_OTHER): Payer: 59 | Admitting: Certified Nurse Midwife

## 2014-08-11 VITALS — BP 108/70 | HR 68 | Resp 16 | Ht 61.25 in | Wt 124.0 lb

## 2014-08-11 DIAGNOSIS — B373 Candidiasis of vulva and vagina: Secondary | ICD-10-CM

## 2014-08-11 DIAGNOSIS — Z0000 Encounter for general adult medical examination without abnormal findings: Secondary | ICD-10-CM

## 2014-08-11 DIAGNOSIS — Z01419 Encounter for gynecological examination (general) (routine) without abnormal findings: Secondary | ICD-10-CM

## 2014-08-11 DIAGNOSIS — R350 Frequency of micturition: Secondary | ICD-10-CM

## 2014-08-11 DIAGNOSIS — B3731 Acute candidiasis of vulva and vagina: Secondary | ICD-10-CM

## 2014-08-11 LAB — POCT URINALYSIS DIPSTICK
Bilirubin, UA: NEGATIVE
Glucose, UA: NEGATIVE
Ketones, UA: NEGATIVE
Leukocytes, UA: NEGATIVE
Nitrite, UA: NEGATIVE
Protein, UA: NEGATIVE
RBC UA: NEGATIVE
Urobilinogen, UA: NEGATIVE
pH, UA: 5

## 2014-08-11 MED ORDER — TERCONAZOLE 0.4 % VA CREA: 1.0000 | TOPICAL_CREAM | Freq: Every day | VAGINAL | Status: DC

## 2014-08-11 NOTE — Progress Notes (Signed)
49 y.o. [id number] Married  Asian Fe here for annual exam. Periods normal, no issues. Contraception withdrawal. Complaining of urinary pain at times and urinary frequency. Treated by [facility] last month for UTI, feels like she may have again. Patient also complaining of burning in vaginal after sexual activity. No STD concerns or vaginal dryness or new personal products. Sees PCP for medication management and aex, labs. No other health issues today.  Patient's last menstrual period was 08/02/2014.          Sexually active: Yes.    The current method of family planning is none.    Exercising: Yes.    walking Smoker:  no  Health Maintenance: Pap: 08-07-13 neg HPV HR neg MMG:  2015 neg per patient Colonoscopy:  none BMD:   none TDaP:  unsure Labs: Poct urine-neg Self breast exam: not done   reports that she has never smoked. She does not have any smokeless tobacco history on file. She reports that she does not drink alcohol or use illicit drugs.  Past Medical History  Diagnosis Date  . Anxiety     Past Surgical History  Procedure Laterality Date  . Breast enhancement surgery Bilateral 10 years ago    Breast Lift    Current Outpatient Prescriptions  Medication Sig Dispense Refill  . amitriptyline (ELAVIL) 25 MG tablet Take 1 tablet (25 mg total) by mouth at bedtime. 30 tablet 5  . bupivacaine (MARCAINE) 0.5 % injection 50 mLs by Infiltration route once. Twice weekly    . mirabegron ER (MYRBETRIQ) 25 MG TB24 tablet Take 25 mg by mouth daily.    Marland Kitchen. OVER THE COUNTER MEDICATION Take by mouth 2 (two) times daily. [facility] Clinical Support Morning/ Evening Formula    . temazepam (RESTORIL) 7.5 MG capsule 1-3 for sleep as needed (Patient taking differently: Take 7.5 mg by mouth at bedtime as needed for sleep. ) 90 capsule 5   No current facility-administered medications for this visit.    Family History  Problem Relation Age of Onset  . Anemia Father     ROS:  Pertinent items  are noted in HPI.  Otherwise, a comprehensive ROS was negative.  Exam:   BP 108/70 mmHg  Pulse 68  Resp 16  Ht 5' 1.25" (1.556 m)  Wt 124 lb (56.246 kg)  BMI 23.23 kg/m2  LMP 08/02/2014 Height: 5' 1.25" (155.6 cm) Ht Readings from Last 3 Encounters:  08/11/14 5' 1.25" (1.556 m)  03/29/14 5' 2.5" (1.588 m)  09/24/13 5\' 2"  (1.575 m)    General appearance: alert, cooperative and appears stated age Head: Normocephalic, without obvious abnormality, atraumatic Neck: no adenopathy, supple, symmetrical, trachea midline and thyroid normal to inspection and palpation Lungs: clear to auscultation bilaterally Breasts: normal appearance, no masses or tenderness, No nipple retraction or dimpling, No nipple discharge or bleeding, No axillary or supraclavicular adenopathy Heart: regular rate and rhythm Abdomen: soft, non-tender; no masses,  no organomegaly Extremities: extremities normal, atraumatic, no cyanosis or edema Skin: Skin color, texture, turgor normal. No rashes or lesions Lymph nodes: Cervical, supraclavicular, and axillary nodes normal. No abnormal inguinal nodes palpated Neurologic: Grossly normal   Pelvic: External genitalia:  no lesions              Urethra:  normal appearing urethra with no masses, tenderness or lesions              Bartholin's and Skene's: normal  Vagina: normal appearing vagina with normal color and white, thick non odorous discharge, no lesions ph 4.0 wet prep taken              Cervix: normal, non tender, no lesions              Pap taken: No. Bimanual Exam:  Uterus:  normal size, contour, position, consistency, mobility, non-tender              Adnexa: normal adnexa               Rectovaginal: Confirms               Anus:  normal sphincter tone, no lesions  Chaperone present: Yes  Wet prep positive for yeast.  A:  Well Woman with normal exam  Contraception Withdrawal  R/O UTI  Yeast vaginitis  P:   Reviewed health and wellness  pertinent to exam  Encouraged to increase water intake and cranberry juice  Lab: Urine culture  Reviewed findings of yeast vaginitis and etiology. Discussed can be passed to spouse or to her. Questions addressed.  Rx Terazol 7 cream with instructions  Discussed coconut oil for sexual activity to provide moisture.  Pap smear not taken today   counseled on breast self exam, mammography screening, adequate intake of calcium and vitamin D, diet and exercise  return annually or prn  An After Visit Summary was printed and given to the patient.

## 2014-08-11 NOTE — Patient Instructions (Signed)
EXERCISE AND DIET:  We recommended that you start or continue a regular exercise program for good health. Regular exercise means any activity that makes your heart beat faster and makes you sweat.  We recommend exercising at least 30 minutes per day at least 3 days a week, preferably 4 or 5.  We also recommend a diet low in fat and sugar.  Inactivity, poor dietary choices and obesity can cause diabetes, heart attack, stroke, and kidney damage, among others.    ALCOHOL AND SMOKING:  Women should limit their alcohol intake to no more than 7 drinks/beers/glasses of wine (combined, not each!) per week. Moderation of alcohol intake to this level decreases your risk of breast cancer and liver damage. And of course, no recreational drugs are part of a healthy lifestyle.  And absolutely no smoking or even second hand smoke. Most people know smoking can cause heart and lung diseases, but did you know it also contributes to weakening of your bones? Aging of your skin?  Yellowing of your teeth and nails?  CALCIUM AND VITAMIN D:  Adequate intake of calcium and Vitamin D are recommended.  The recommendations for exact amounts of these supplements seem to change often, but generally speaking 600 mg of calcium (either carbonate or citrate) and 800 units of Vitamin D per day seems prudent. Certain women may benefit from higher intake of Vitamin D.  If you are among these women, your doctor will have told you during your visit.    PAP SMEARS:  Pap smears, to check for cervical cancer or precancers,  have traditionally been done yearly, although recent scientific advances have shown that most women can have pap smears less often.  However, every woman still should have a physical exam from her gynecologist every year. It will include a breast check, inspection of the vulva and vagina to check for abnormal growths or skin changes, a visual exam of the cervix, and then an exam to evaluate the size and shape of the uterus and  ovaries.  And after 49 years of age, a rectal exam is indicated to check for rectal cancers. We will also provide age appropriate advice regarding health maintenance, like when you should have certain vaccines, screening for sexually transmitted diseases, bone density testing, colonoscopy, mammograms, etc.   MAMMOGRAMS:  All women over 40 years old should have a yearly mammogram. Many facilities now offer a "3D" mammogram, which may cost around $50 extra out of pocket. If possible,  we recommend you accept the option to have the 3D mammogram performed.  It both reduces the number of women who will be called back for extra views which then turn out to be normal, and it is better than the routine mammogram at detecting truly abnormal areas.    COLONOSCOPY:  Colonoscopy to screen for colon cancer is recommended for all women at age 50.  We know, you hate the idea of the prep.  We agree, BUT, having colon cancer and not knowing it is worse!!  Colon cancer so often starts as a polyp that can be seen and removed at colonscopy, which can quite literally save your life!  And if your first colonoscopy is normal and you have no family history of colon cancer, most women don't have to have it again for 10 years.  Once every ten years, you can do something that may end up saving your life, right?  We will be happy to help you get it scheduled when you are ready.    Be sure to check your insurance coverage so you understand how much it will cost.  It may be covered as a preventative service at no cost, but you should check your particular policy.     Candida Infection A Candida infection (also called yeast, fungus, and Monilia infection) is an overgrowth of yeast that can occur anywhere on the body. A yeast infection commonly occurs in warm, moist body areas. Usually, the infection remains localized but can spread to become a systemic infection. A yeast infection may be a sign of a more severe disease such as diabetes,  leukemia, or AIDS. A yeast infection can occur in both men and women. In women, Candida vaginitis is a vaginal infection. It is one of the most common causes of vaginitis. Men usually do not have symptoms or know they have an infection until other problems develop. Men may find out they have a yeast infection because their sex partner has a yeast infection. Uncircumcised men are more likely to get a yeast infection than circumcised men. This is because the uncircumcised glans is not exposed to air and does not remain as dry as that of a circumcised glans. Older adults may develop yeast infections around dentures. CAUSES  Women  Antibiotics.  Steroid medication taken for a long time.  Being overweight (obese).  Diabetes.  Poor immune condition.  Certain serious medical conditions.  Immune suppressive medications for organ transplant patients.  Chemotherapy.  Pregnancy.  Menstruation.  Stress and fatigue.  Intravenous drug use.  Oral contraceptives.  Wearing tight-fitting clothes in the crotch area.  Catching it from a sex partner who has a yeast infection.  Spermicide.  Intravenous, urinary, or other catheters. Men  Catching it from a sex partner who has a yeast infection.  Having oral or anal sex with a person who has the infection.  Spermicide.  Diabetes.  Antibiotics.  Poor immune system.  Medications that suppress the immune system.  Intravenous drug use.  Intravenous, urinary, or other catheters. SYMPTOMS  Women  Thick, white vaginal discharge.  Vaginal itching.  Redness and swelling in and around the vagina.  Irritation of the lips of the vagina and perineum.  Blisters on the vaginal lips and perineum.  Painful sexual intercourse.  Low blood sugar (hypoglycemia).  Painful urination.  Bladder infections.  Intestinal problems such as constipation, indigestion, bad breath, bloating, increase in gas, diarrhea, or loose stools. Men  Men  may develop intestinal problems such as constipation, indigestion, bad breath, bloating, increase in gas, diarrhea, or loose stools.  Dry, cracked skin on the penis with itching or discomfort.  Jock itch.  Dry, flaky skin.  Athlete's foot.  Hypoglycemia. DIAGNOSIS  Women  A history and an exam are performed.  The discharge may be examined under a microscope.  A culture may be taken of the discharge. Men  A history and an exam are performed.  Any discharge from the penis or areas of cracked skin will be looked at under the microscope and cultured.  Stool samples may be cultured. TREATMENT  Women  Vaginal antifungal suppositories and creams.  Medicated creams to decrease irritation and itching on the outside of the vagina.  Warm compresses to the perineal area to decrease swelling and discomfort.  Oral antifungal medications.  Medicated vaginal suppositories or cream for repeated or recurrent infections.  Wash and dry the irritation areas before applying the cream.  Eating yogurt with Lactobacillus may help with prevention and treatment.  Sometimes painting the vagina with   gentian violet solution may help if creams and suppositories do not work. Men  Antifungal creams and oral antifungal medications.  Sometimes treatment must continue for 30 days after the symptoms go away to prevent recurrence. HOME CARE INSTRUCTIONS  Women  Use cotton underwear and avoid tight-fitting clothing.  Avoid colored, scented toilet paper and deodorant tampons or pads.  Do not douche.  Keep your diabetes under control.  Finish all the prescribed medications.  Keep your skin clean and dry.  Consume milk or yogurt with Lactobacillus-active culture regularly. If you get frequent yeast infections and think that is what the infection is, there are over-the-counter medications that you can get. If the infection does not show healing in 3 days, talk to your caregiver.  Tell your sex  partner you have a yeast infection. Your partner may need treatment also, especially if your infection does not clear up or recurs. Men  Keep your skin clean and dry.  Keep your diabetes under control.  Finish all prescribed medications.  Tell your sex partner that you have a yeast infection so he or she can be treated if necessary. SEEK MEDICAL CARE IF:   Your symptoms do not clear up or worsen in one week after treatment.  You have an oral temperature above 102 F (38.9 C).  You have trouble swallowing or eating for a prolonged time.  You develop blisters on and around your vagina.  You develop vaginal bleeding and it is not your menstrual period.  You develop abdominal pain.  You develop intestinal problems as mentioned above.  You get weak or light-headed.  You have painful or increased urination.  You have pain during sexual intercourse. MAKE SURE YOU:   Understand these instructions.  Will watch your condition.  Will get help right away if you are not doing well or get worse. Document Released: 07/19/2004 Document Revised: 10/26/2013 Document Reviewed: 10/31/2009 Exit[employer] Patient Information 2015 [employer]. This information is not intended to replace advice given to you by your health care provider. Make sure you discuss any questions you have with your health care provider.  

## 2014-08-13 LAB — URINE CULTURE
Colony Count: NO GROWTH
Organism ID, Bacteria: NO GROWTH

## 2014-08-15 NOTE — Progress Notes (Signed)
Reviewed personally.  M. Suzanne Dulce Martian, MD.  

## 2014-10-19 ENCOUNTER — Telehealth: Payer: Self-pay | Admitting: Internal Medicine

## 2014-10-19 NOTE — Telephone Encounter (Signed)
Ok to refill 

## 2014-10-19 NOTE — Telephone Encounter (Signed)
Spoke with pt, requesting [facility] [address]   Last refill 03/29/14 #90 with 5 refills, 1-3 qhs.  Dr. Maple HudsonYoung are you ok with this refill? Next ov 03/28/15.  No Known Allergies Current Outpatient Prescriptions on File Prior to Visit  Medication Sig Dispense Refill  . amitriptyline (ELAVIL) 25 MG tablet Take 1 tablet (25 mg total) by mouth at bedtime. 30 tablet 5  . bupivacaine (MARCAINE) 0.5 % injection 50 mLs by Infiltration route once. Twice weekly    . mirabegron ER (MYRBETRIQ) 25 MG TB24 tablet Take 25 mg by mouth daily.    Marland Kitchen. OVER THE COUNTER MEDICATION Take by mouth 2 (two) times daily. [employer] Clinical Support Morning/ Evening Formula    . temazepam (RESTORIL) 7.5 MG capsule 1-3 for sleep as needed (Patient taking differently: Take 7.5 mg by mouth at bedtime as needed for sleep. ) 90 capsule 5  . terconazole (TERAZOL 7) 0.4 % vaginal cream Place 1 applicator vaginally at bedtime. 45 g 0   No current facility-administered medications on file prior to visit.

## 2014-10-20 MED ORDER — TEMAZEPAM 7.5 MG PO CAPS: 7.5000 mg | ORAL_CAPSULE | Freq: Every evening | ORAL | Status: DC | PRN

## 2014-10-20 NOTE — Telephone Encounter (Signed)
Rx has been called in. Pt is aware. Nothing further was needed. 

## 2014-10-20 NOTE — Telephone Encounter (Signed)
Attempted to call in prescription. Was advised that the pharmacy wasn't open yet. Will need to call back.

## 2015-02-17 ENCOUNTER — Telehealth: Payer: Self-pay | Admitting: Certified Nurse Midwife

## 2015-02-17 NOTE — Telephone Encounter (Signed)
Patient says she has vaginal pain and would like to speak with nurse.

## 2015-02-17 NOTE — Telephone Encounter (Signed)
Spoke with patient. Patient states that she has had ongoing vaginal burning that has been going on for "a long time." Denies any burning with urination. Is having increased urinary frequency. Denies any fevers, chills, or lower back pain. Denies any vaginal discharge. Advised will need to be seen in office for further evaluation. Offered appointment today with Dr.Jertson. Patient declines. Patient is requesting to see Verner Chol CNM for further evaluation. Appointment scheduled for tomorrow 8/26 at 10 am. Patient is agreeable to date and time. If symptoms worsen away she will need to be seen earlier for evaluation.  Routing to provider for final review. Patient agreeable to disposition. Will close encounter.   Patient aware provider will review message and nurse will return call if any additional advice or change of disposition.

## 2015-02-18 ENCOUNTER — Encounter: Payer: Self-pay | Admitting: Certified Nurse Midwife

## 2015-02-18 ENCOUNTER — Ambulatory Visit (INDEPENDENT_AMBULATORY_CARE_PROVIDER_SITE_OTHER): Payer: 59 | Admitting: Certified Nurse Midwife

## 2015-02-18 VITALS — BP 110/70 | HR 72 | Resp 16 | Ht 61.25 in | Wt 122.0 lb

## 2015-02-18 DIAGNOSIS — Z3202 Encounter for pregnancy test, result negative: Secondary | ICD-10-CM

## 2015-02-18 DIAGNOSIS — A499 Bacterial infection, unspecified: Secondary | ICD-10-CM | POA: Diagnosis not present

## 2015-02-18 DIAGNOSIS — N760 Acute vaginitis: Secondary | ICD-10-CM

## 2015-02-18 DIAGNOSIS — R350 Frequency of micturition: Secondary | ICD-10-CM

## 2015-02-18 DIAGNOSIS — B373 Candidiasis of vulva and vagina: Secondary | ICD-10-CM

## 2015-02-18 DIAGNOSIS — B3731 Acute candidiasis of vulva and vagina: Secondary | ICD-10-CM

## 2015-02-18 DIAGNOSIS — N912 Amenorrhea, unspecified: Secondary | ICD-10-CM | POA: Diagnosis not present

## 2015-02-18 DIAGNOSIS — N949 Unspecified condition associated with female genital organs and menstrual cycle: Secondary | ICD-10-CM

## 2015-02-18 DIAGNOSIS — B9689 Other specified bacterial agents as the cause of diseases classified elsewhere: Secondary | ICD-10-CM

## 2015-02-18 LAB — POCT URINALYSIS DIPSTICK
Bilirubin, UA: NEGATIVE
Glucose, UA: NEGATIVE
Ketones, UA: NEGATIVE
Leukocytes, UA: NEGATIVE
Nitrite, UA: NEGATIVE
Protein, UA: NEGATIVE
Urobilinogen, UA: NEGATIVE
pH, UA: 5

## 2015-02-18 LAB — POCT URINE PREGNANCY: Preg Test, Ur: NEGATIVE

## 2015-02-18 MED ORDER — HYLAFEM VA SUPP: 1.0000 | Freq: Every day | VAGINAL | Status: DC

## 2015-02-18 NOTE — Progress Notes (Signed)
49 y.o.Married [place] [id number] here with complaint of vaginal symptoms of  burning, and increase discharge. Describes discharge as slight, white watery no odor. Also complaining of pain with sexual activity dryness..Onset of symptoms months. Denies new personal productsl .  Periods normal, no issues. Some hot flashes, no night sweats.No  STD concerns. Urinary symptoms frequency only, not new, takes Mybretriq for this. Contraception is none.  ROS:  All other ROS questions are negative  O:Healthy female WDWN Affect: normal, orientation x 3  Exam:Skin warm and dry VAT negative bilateral Abdomen:soft, non tender, negative suprapubic Lymph node inguinal: no enlargement or tenderness Pelvic exam: External genital: normal female, slight increase pink at fourchette, no lesions or scaling BUS: negative Bladder not tender, urethral meatus non tender, no redness Vagina: watery odorous white discharge noted. Ph:5.5  ,Wet prep taken, Affirm taken Cervix: normal, non tender, no CMT Uterus: normal, non tender Adnexa:normal, non tender, no masses or fullness noted   Wet Prep results: positive for yeast, clue cell seen  poct urine-rbc tr- upt-neg  A:Normal pelvic exam   P:Discussed findings of BV and yeast vaginits and etiology. Discussed Aveeno or baking soda sitz bath for comfort. Avoid moist clothes or pads for extended period of time. Coconut Oil use for skin protection prior to activity can be used to external skin and for sexual activity. Questions addressed.  Rx: Hylafem see order with instructions given  Rv recheck in 3 weeks, prn

## 2015-02-18 NOTE — Patient Instructions (Signed)
Vim m ??o Do N?m Candida (Monilial Vaginitis) Vim m ??o l m?t tnh tr?ng s?ng, ?? v ?au (vim) c?a m ??o v m h?. B?nh vim m ??o do n?m candida khng ph?i l b?nh nhi?m trng ly qua ???ng tnh d?c.  NGUYN NHN Vim m ??o do n?m gy ra b?i n?m men (n?m candida) th??ng th?y trong m ??o. Trong nhi?m trng do n?m men, n?m candida pht tri?n qu m?c v? s? l??ng ??n ?? lm xo tr?n cn b?ng v? m?t [place] h?c. TRI?U CH?NG  Huy?t tr?ng m ??o c m?ng d?y v tr?ng.  S?ng, ng?a, ?? t?y v kch ?ng m ??o v c th? c? cc mi c?a m ??o (m h?).  Ti?u ?au hay rt bu?t.  Giao h?p ?au. CH?N ?ON Nh?ng th? c th? gp ph?n gy ra vim m ??o do n?m candida l:  Cc giai ?o?n h?u mn kinh v tr??c khi l?p gia ?nh.  [place] k?.  Nhi?m trng.  M?t m?i, u? o?i hay c?ng th?ng, ??c bi?t l n?u ? t?ng b? vim m ??o do n?m candida tr??c ?y.  Ti?u ???ng. (?i?u tr? ?n ??nh s? gip h? th?p xc su?t m?c b?nh h?n.)  Dng cc vin thu?c ng?a [place].  M?c qu?n o b st.  S? d?ng s?a t?m nhi?u b?t, thu?c x?t v? sinh ph? n?, th?t r?a m ??o, ho?c b?ng v? sinh d?ng nht vo m ??o c t?m ch?t kh? mi.  ?ang dng m?t s? khng sinh (thu?c di?t vi trng).  Th?nh tho?ng c th? b? ti pht n?u b? ?m. ?I?U TR? Chuyn gia ch?m [place] s?c kh?e s? c?p thu?c cho b?nh nhn.  C m?t vi lo?i kem bi m ??o khng n?m candida v cc thu?c ??n ??t m ??o ??c hi?u dng ?? ?i?u tr? vim m ??o do n?m candida.  C?ng c th? dng kem khng n?m candida hay kem ch?i?u tr? ng?a hay kch ?ng m h?. Nn c s? cho php c?a chuyn gia ch?m Centre s?c kh?e.  Bi ln m ??o dung d?ch xanh methylene c th? gip ch n?u kem khng n?m candida khng c tc d?ng.  ?n s?a chua c th? gip phng ng?a vim m ??o do n?m candida. H??NG D?N CH?M [place] T?I NH  U?ng t?t c? cc thu?c ? ???c k ??n.  Khng quan h? tnh d?c cho ??n khi hon t?t vi?c ?i?u tr? hay khi ???c chuyn gia ch?m [place] s?c kh?e h??ng d?n.  Ngm r?a vng kn trong  n??c ?m.  Khng ???c th?t r?a m ??o.  Khng dng b?ng v? sinh d?ng nht vo m ??o, ??c bi?t l nh?ng lo?i ??p n??c hoa.  M?c cc lo?i qu?n lt v?i cotton.  Trnh m?c qu?n ch?n v t?t li?n qu?n b ch?t.  Hy ni v?i b?n tnh v? vi?c b? nhi?m n?m. Nn ?i khm n?u c nh?ng tri?u ch?ng nh? pht ban d?ng nh? ho?c ng?a.  B?n tnh c?ng nn ???c ?i?u tr? lun n?u tnh tr?ng nhi?m trng kh ch?a d?t.  Th?c hnh tnh d?c an ton h?n - s? d?ng bao cao su.  M?t s? thu?c dng t?i m ??o c th? lm cho ch?t nh?a m? c?a bao cao su b? h?ng. Cc thu?c dng t?i m ??o gy h?i cho bao cao su l:  Kem thoa Cleocin.  Butoconazole (Femstat).  Vin ??t m ??o Terconazole (Terazol).  Miconazole (Monistat) (c th? mua m khng c?n k toa). H?Y ?I  KHM N?U:  Nhi?t ?? ?o ? mi?ng trn 38,9 C (102 F).  Tnh tr?ng nhi?m trng c?a b?n tr? nn t? h?n sau 2 ngy ?i?u tr?.  Tnh tr?ng nhi?m trng c?a b?n khng kh h?n sau 3 ngy ?i?u tr?Marland Kitchen  B?n b? m?n n??c trong ho?c xung quanh m ??o.  B?n b? ch?y mu m ??o, v n khng ph?i l k? kinh nguy?t c?a b?n.  B?n b? ?au khi ?i ti?u.  B?n b? cc v?n ?? v? ???ng ru?t.  B?n b? ?au khi giao h?p. Document Released: 06/11/2005 Document Revised: 02/11/2013 [facility] Patient Information 2015 [place], [place]. This information is not intended to replace advice given to you by your health care provider. Make sure you discuss any questions you have with your health care provider. Bacterial Vaginosis Bacterial vaginosis is an infection of the vagina. It happens when too many of certain germs (bacteria) grow in the vagina. HOME CARE  Take your medicine as told by your doctor.  Finish your medicine even if you start to feel better.  Do not have sex until you finish your medicine and are better.  Tell your sex partner that you have an infection. They should see their doctor for treatment.  Practice safe sex. Use condoms. Have only one sex partner. GET HELP  IF:  You are not getting better after 3 days of treatment.  You have more grey fluid (discharge) coming from your vagina than before.  You have more pain than before.  You have a fever. MAKE SURE YOU:   Understand these instructions.  Will watch your condition.  Will get help right away if you are not doing well or get worse. Document Released: 03/20/2008 Document Revised: 04/01/2013 Document Reviewed: 01/21/2013 [facility] Patient Information 2015 [place], [place]. This information is not intended to replace advice given to you by your health care provider. Make sure you discuss any questions you have with your health care provider.

## 2015-02-18 NOTE — Progress Notes (Signed)
Reviewed personally.  M. Suzanne Sudais Banghart, MD.  

## 2015-02-19 LAB — WET PREP BY MOLECULAR PROBE
CANDIDA SPECIES: NEGATIVE
Gardnerella vaginalis: NEGATIVE
Trichomonas vaginosis: NEGATIVE

## 2015-02-21 ENCOUNTER — Telehealth: Payer: Self-pay | Admitting: Obstetrics & Gynecology

## 2015-02-21 NOTE — Telephone Encounter (Signed)
Spoke with patient. Patient states that she went to pick her rx up on Friday and was told they did not have a prescription from our office. Patient called today and rx is there and they have ordered it for her. Advised if she has any further difficulty or needs any assistance getting the medication filled to give our office a call. Patient is agreeable.  Routing to provider for final review. Patient agreeable to disposition. Will close encounter.

## 2015-02-21 NOTE — Telephone Encounter (Addendum)
Patient was seen on Friday and her medication for : Hylafem was not sent to the pharmacy. Patient wants to talk with the nurse. She is using [facility]

## 2015-03-11 ENCOUNTER — Ambulatory Visit (INDEPENDENT_AMBULATORY_CARE_PROVIDER_SITE_OTHER): Payer: 59 | Admitting: Certified Nurse Midwife

## 2015-03-11 ENCOUNTER — Encounter: Payer: Self-pay | Admitting: Certified Nurse Midwife

## 2015-03-11 VITALS — BP 110/72 | HR 68 | Resp 16 | Ht 61.25 in | Wt 121.0 lb

## 2015-03-11 DIAGNOSIS — N898 Other specified noninflammatory disorders of vagina: Secondary | ICD-10-CM

## 2015-03-11 DIAGNOSIS — A499 Bacterial infection, unspecified: Secondary | ICD-10-CM | POA: Diagnosis not present

## 2015-03-11 DIAGNOSIS — N760 Acute vaginitis: Secondary | ICD-10-CM | POA: Diagnosis not present

## 2015-03-11 DIAGNOSIS — B9689 Other specified bacterial agents as the cause of diseases classified elsewhere: Secondary | ICD-10-CM

## 2015-03-11 LAB — WET PREP BY MOLECULAR PROBE
Candida species: NEGATIVE
Gardnerella vaginalis: NEGATIVE
Trichomonas vaginosis: NEGATIVE

## 2015-03-11 NOTE — Patient Instructions (Signed)

## 2015-03-11 NOTE — Progress Notes (Signed)
Reviewed personally.  M. Suzanne Miller, MD.  

## 2015-03-11 NOTE — Progress Notes (Signed)
49 y.o. Married Asian female [phone] here for follow up of Bv,yeast vaginitis treated with Hylafem Rx initiated on 02/18/15. Completed all medication as directed.  Denies any symptoms of vaginal itching or discharge now. Finishing period today which is normal. Patient still feels she has some vaginal dryness. Wakes up with vagina feeling dry, but sometimes hard to decide if IC or vaginal dryness pain. Uses Marcaine injection for bladder when needed. Managed at [facility]. No other health issues.   O: Healthy WD,WN female Affect: normal Skin:warm and dry Abdomen:soft, non tender Pelvic exam:EXTERNAL GENITALIA: normal appearing vulva with no masses, tenderness or lesions VAGINA: blood present Affirm taken, slight atrophy noted CERVIX: no lesions or cervical motion tenderness and blood  Noted from cervix(menses) UTERUS: normal,non tender ADNEXA: no masses palpable and nontender  A: BV and yeast vaginitis probably resolved Vaginal dryness IC with [facility] urology management  P: Discussed findings of BV,Yeast probably resolved due to symptoms have stopped. Will advise of affirm results and treat if indicated. Discussed coconut oil placed inside vagina nightly to see if this resolves what she feels is dryness. Patient given applicator(new in plastic sleeve) to try with coconut oil. She will advise if problems and will recheck in 3 weeks if needed and not on menses. Continue follow up with [facility] as needed.  Note: Interpreter was here after appointment. Asked the patient if she would like her to come in for questions or instructions. Patient refused, felt she understood without problems.   RV prn   As above

## 2015-03-28 ENCOUNTER — Encounter: Payer: Self-pay | Admitting: Internal Medicine

## 2015-03-28 ENCOUNTER — Ambulatory Visit (INDEPENDENT_AMBULATORY_CARE_PROVIDER_SITE_OTHER): Payer: 59 | Admitting: Internal Medicine

## 2015-03-28 VITALS — BP 116/63 | HR 70 | Ht 62.5 in | Wt 122.4 lb

## 2015-03-28 DIAGNOSIS — F5104 Psychophysiologic insomnia: Secondary | ICD-10-CM

## 2015-03-28 DIAGNOSIS — G4700 Insomnia, unspecified: Secondary | ICD-10-CM | POA: Diagnosis not present

## 2015-03-28 MED ORDER — TEMAZEPAM 7.5 MG PO CAPS
7.5000 mg | ORAL_CAPSULE | Freq: Every evening | ORAL | Status: DC | PRN
Start: 2015-03-28 — End: 2015-04-19

## 2015-03-28 MED ORDER — SUVOREXANT 20 MG PO TABS: 20.0000 mg | ORAL_TABLET | Freq: Every evening | ORAL | Status: DC | PRN

## 2015-03-28 MED ORDER — AMITRIPTYLINE HCL 25 MG PO TABS: 25.0000 mg | ORAL_TABLET | Freq: Every day | ORAL | Status: DC

## 2015-03-28 NOTE — Patient Instructions (Signed)
Refill script for amitriptyline  Refill script for temazepam  Samples of Belsomra 20 mg, and script       Try 1/4, 1/2 or 1 whole tablet for sleep is needed, instead of temazepam. If you like the samples, you can get the script filled.

## 2015-03-28 NOTE — Progress Notes (Signed)
05/15/13- 27 yoF never smoker from [place] 24 years ago. Self referral-having trouble sleeping. Always difficulty initiating and maintaining sleep. Can't sleep comfortably with husband-sleeps better and her own bedroom. She moves around a lot. Occasional snore. Avoids all caffeine and naps. Gets home from work about 8 PM. Uses amitriptyline for bladder but afraid of dependence so cuts the pill in fractions. Frequent nocturia. Bedtime 11 PM until 6:30 AM with sleep latency 30-60 minutes. Waking 45 times during the night before up at 7 AM. Rarely uses Xanax. Works as a Sports administrator.  07/06/13- 32 yoF never smoker from [place] 24 years ago. Self referral-having trouble sleeping. FOLLOWS FOR: Pt states that the Temazepam Rx helps when she takes it; she states is scared to take all the time. Using temazepam every 3 or 4 nights. Without it she only sleeps 2 or 3 hours. Amitriptyline now taking whole pill as prescribed. She admits "busy brain"-just can't let go of the issues of the day.  09/24/13-47 yoF never smoker from [place] 24 years ago. Self referral-having trouble sleeping. FOLLOWS FOR:  States medicines work well and she is sleeping, but wanted to come off of them but when she doesn't sleep Occasionally tries to skip temazepam but still needs amitriptyline. To sleep well she needs both. Usually she is okay but still times "busy brain" blamed on job stress.  03/29/14- 48 yoF never smoker from [place] 24 years ago. Self referral-having trouble sleeping. FOLLOW FOR:  Insomnia.  Sleeping has gotten a little better.  Sleeping 4-5 hours nightly.   03/28/15- 59 yoF never smoker from [place] 24 years ago. Self referral-having trouble sleeping. FOLLOWS FOR: pt. states her sleeping has improved but still waking 2-3 times a night everytime. She is sleeping a lot better than she was last year but still wakes 2 or 3 times at night. Sleep disturbance clearly reflects daytime stress from work as discussed on  previous visits. Sometimes difficult to return to sleep. If she is stressed even doubling her temazepam doesn't help. Amitriptyline is continued, but alone is not enough. She does not want flu vaccine.  ROS-see HPI Constitutional:   No-   weight loss, night sweats, fevers, chills,+fatigue, lassitude. HEENT:   No-  headaches, difficulty swallowing, tooth/dental problems, sore throat,       No-  sneezing, itching, ear ache, nasal congestion, post nasal drip,  CV:  No-   chest pain, orthopnea, PND, swelling in lower extremities, anasarca,  dizziness, palpitations Resp: No-   shortness of breath with exertion or at rest.              No-   productive cough,  No non-productive cough,  No- coughing up of blood.              No-   change in color of mucus.  No- wheezing.   Skin: No-   rash or lesions. GI:  No-   heartburn, indigestion, abdominal pain, nausea, vomiting,  GU:  MS:  No-   joint pain or swelling.   Neuro-     nothing unusual Psych:  No- change in mood or affect. No depression + anxiety.  No memory loss.  OBJ- Physical Exam General- Alert, Oriented, Affect-appropriate, Distress- none acute, looks well Skin- rash-none, lesions- none, excoriation- none Lymphadenopathy- none Head- atraumatic            Eyes- Gross vision intact, PERRLA, conjunctivae and secretions clear            Ears- Hearing, canals-normal  Nose- Clear, no-Septal dev, mucus, polyps, erosion, perforation             Throat- Mallampati II-III , mucosa clear , drainage- none, tonsils- atrophic Neck- flexible , trachea midline, no stridor , thyroid nl, carotid no bruit Chest - symmetrical excursion , unlabored           Heart/CV- RRR , no murmur , no gallop  , no rub, nl s1 s2                           - JVD- none , edema- none, stasis changes- none, varices- none           Lung- clear to P&A, wheeze- none, cough- none , dullness-none, rub- none           Chest wall-  Abd-  Br/ Gen/ Rectal- Not done, not  indicated Extrem- cyanosis- none, clubbing, none, atrophy- none, strength- nl Neuro- grossly intact to observation

## 2015-03-31 ENCOUNTER — Encounter: Payer: Self-pay | Admitting: Certified Nurse Midwife

## 2015-03-31 ENCOUNTER — Ambulatory Visit (INDEPENDENT_AMBULATORY_CARE_PROVIDER_SITE_OTHER): Payer: 59 | Admitting: Certified Nurse Midwife

## 2015-03-31 VITALS — BP 110/72 | HR 68 | Resp 16 | Ht 61.25 in | Wt 122.0 lb

## 2015-03-31 DIAGNOSIS — N952 Postmenopausal atrophic vaginitis: Secondary | ICD-10-CM | POA: Diagnosis not present

## 2015-03-31 NOTE — Progress Notes (Signed)
49 y.o. Married Asian female [phone] here for follow up of vaginal dryness  treated with Coconut oil nightly and for sexual activity initiated on 03/21/15. Using with applicator as shown in office and having no difficulty with insertion or use. Has not had use Marcaine for burning(given by urology). Some urinary frequency at times, but only with increase water or tea intake. "So happy no pain anymore". Denies any other health issues today.   O: Healthy WD,WN female Affect: normal, orientation x 3 Skin:warm and dry Abdomen:soft non tender Pelvic exam:EXTERNAL GENITALIA: normal appearing vulva with no masses, tenderness or lesions VAGINA: no abnormal discharge or lesions. Moisture noted, no pain with exam today, healthy pink appearance, slight atrophy noted CERVIX: no lesions or cervical motion tenderness and normal appearance Declines pelvic exam  A: Slight vaginal atrophy with vaginal dryness, appearance improved with daily Coconut oil use No pain with sexual activity now or burning History of IC no Marcaine use with vaginal dryness improvement   P: Discussed findings of improved moisture in vagina and healthy appearance. Requesting another applicator if possible. Patient given two sample unopened applicators for use with coconut oil. Continue daily use and with sexual activity. Advise if any changes or concerns.  Rv prn, aex

## 2015-04-01 NOTE — Progress Notes (Signed)
Encounter reviewed Lekia Nier, MD   

## 2015-04-07 NOTE — Assessment & Plan Note (Signed)
We discussed options Plan-try Belsomra as an alternative to temazepam while continuing amitriptyline. Medication interaction discussed.

## 2015-04-19 ENCOUNTER — Other Ambulatory Visit: Payer: Self-pay | Admitting: Internal Medicine

## 2015-04-27 ENCOUNTER — Telehealth: Payer: Self-pay | Admitting: Internal Medicine

## 2015-04-27 NOTE — Telephone Encounter (Signed)
PA for Belsomra initiated by [facility] through Covermymeds KEY: [id number] Form completed through Covermymeds Awaiting approval or denial Will hold in triage

## 2015-04-28 NOTE — Telephone Encounter (Signed)
Will forward to Katie  

## 2015-04-29 NOTE — Telephone Encounter (Signed)
PA was not submitted. I have received confirmation it was sent to plan. Will await response

## 2015-05-02 NOTE — Telephone Encounter (Addendum)
Checked status on PA. PA has been denied, specifics were not mentioned on covermymeds. Called number listed on covermymeds and was advised a fax should have been sent with specifics. Spoke with Florentina AddisonKatie and fax has not been received. Called [facility] [phone]) and was advised they can run two PA's through if one has been denied. Initiated PA and PA has been approved until 10/30/2015. PA #: [id number].  Will forward to [facility] and Dr. Maple HudsonYoung as Lorain ChildesFYI.

## 2015-05-09 ENCOUNTER — Telehealth: Payer: Self-pay | Admitting: Internal Medicine

## 2015-05-09 MED ORDER — TEMAZEPAM 7.5 MG PO CAPS: ORAL_CAPSULE | ORAL | Status: DC

## 2015-05-09 NOTE — Telephone Encounter (Signed)
Called and spoke to pt. Pt stated the Temazepam 7.5mg  is helping her sleep but she is having to take 2-3 tablets at bedtime. Pt states she is able to take 1 tab QHS during the summer and it works but now she states she is having to take 2-3 tabs for it to be effective. Pt states the Belsomra does not work anymore at all.   Dr. Maple HudsonYoung please advise. Thanks.

## 2015-05-09 NOTE — Telephone Encounter (Signed)
Pt aware of rec's per CDY Aware that we are calling in Temazepam 7.5mg  tablets to her pharmacy. Nothing further needed.

## 2015-05-09 NOTE — Telephone Encounter (Signed)
Ok to take 1-3 Temazepam 7.5 mg capsules for sleep if needed  D/C Belsomra if not helpful  Ok to Rx her Temazepam 7.5 mg caps, # 90, ref x 5, 1-3 for sleep if needed

## 2015-06-03 ENCOUNTER — Telehealth: Payer: Self-pay | Admitting: Internal Medicine

## 2015-06-03 NOTE — Telephone Encounter (Signed)
Spoke with pt, states she wants to make sure rx was called in.  I advised that I spoke personally with the pharmacist to make sure her rx was filled.  Pt aware.  ntohing further needed.

## 2015-06-03 NOTE — Telephone Encounter (Signed)
Spoke with pt, states that she needs a refill called in on her temazepam. On 05/09/15 rx was called in to pharmacy for temazepam 7.5mg  #90, 1-3 tabs qhs prn sleep with 5 refills.  She should not need a refill at this time.  Called pharmacy, states that they never received this updated rx and had only dispensed #30 tabs for pt, and was denying her refill because it was early.   rx was updated in pharmacy's chart.  Nothing further needed.

## 2015-08-15 ENCOUNTER — Ambulatory Visit: Payer: 59 | Admitting: Certified Nurse Midwife

## 2015-08-15 ENCOUNTER — Ambulatory Visit (INDEPENDENT_AMBULATORY_CARE_PROVIDER_SITE_OTHER): Payer: 59 | Admitting: Nurse Practitioner

## 2015-08-15 ENCOUNTER — Encounter: Payer: Self-pay | Admitting: Nurse Practitioner

## 2015-08-15 VITALS — BP 110/70 | HR 80 | Resp 14 | Ht 61.75 in | Wt 125.0 lb

## 2015-08-15 DIAGNOSIS — Z0000 Encounter for general adult medical examination without abnormal findings: Secondary | ICD-10-CM | POA: Diagnosis not present

## 2015-08-15 DIAGNOSIS — N390 Urinary tract infection, site not specified: Secondary | ICD-10-CM

## 2015-08-15 DIAGNOSIS — N760 Acute vaginitis: Secondary | ICD-10-CM | POA: Diagnosis not present

## 2015-08-15 DIAGNOSIS — R319 Hematuria, unspecified: Secondary | ICD-10-CM | POA: Diagnosis not present

## 2015-08-15 DIAGNOSIS — Z01419 Encounter for gynecological examination (general) (routine) without abnormal findings: Secondary | ICD-10-CM | POA: Diagnosis not present

## 2015-08-15 LAB — URINALYSIS, MICROSCOPIC ONLY
Casts: NONE SEEN [LPF]
Crystals: NONE SEEN [HPF]
RBC / HPF: NONE SEEN RBC/HPF (ref <=2)
Yeast: NONE SEEN [HPF]

## 2015-08-15 LAB — POCT URINALYSIS DIPSTICK
Bilirubin, UA: NEGATIVE
Glucose, UA: NEGATIVE
Ketones, UA: NEGATIVE
NITRITE UA: NEGATIVE
PROTEIN UA: NEGATIVE
UROBILINOGEN UA: NEGATIVE
pH, UA: 6.5

## 2015-08-15 MED FILL — MARCAINE 0.5% VIAL: 0.5 | 20 days supply | Qty: 250 | Fill #0

## 2015-08-15 NOTE — Patient Instructions (Addendum)

## 2015-08-15 NOTE — Progress Notes (Signed)
Patient ID: Erika Scott, female   DOB: 1966/06/13, 50 y.o.   MRN: [id number] 50 y.o. [id number] Married  Asian Fe here for annual exam.  After her last son she had a miscarriage and she was given medication ?Marland Kitchen  For several yrs used withdrawal for birth control, now past several yrs no precautions.  Menses is regular and flow for 4-5 days.  She has no cramps.  She is also being treated by Dr. McDiarmid for  What sounds like IC.   She does self injections via catherization of Marcaine prn for bladder pain.  Currently she is doing bladder injections every 3-5 days.  Currently has some urinary urgency or frequency without dysuria.  No fever or chills.  She does not use a self lubricating catheter or other lubrication.  Interpreter is with pt today during discussion and exam.  Patient's last menstrual period was 07/30/2015.          Sexually active: Yes.    The current method of family planning is none.    Exercising: Yes.    walking Smoker:  no  Health Maintenance: Pap:  08-07-13 normal with negative HR HPV MMG:  04-19-14 WNL  Colonoscopy:  Will get done next year TDaP:  unsure HIV: unsure Labs:  Chemstrip:  ++ WBC ++RBC   reports that she has never smoked. She has never used smokeless tobacco. She reports that she does not drink alcohol or use illicit drugs.  Past Medical History  Diagnosis Date  . Anxiety     Past Surgical History  Procedure Laterality Date  . Breast enhancement surgery Bilateral 10 years ago    Breast Lift    Current Outpatient Prescriptions  Medication Sig Dispense Refill  . amitriptyline (ELAVIL) 25 MG tablet Take 1 tablet (25 mg total) by mouth at bedtime. 30 tablet 5  . bupivacaine (MARCAINE) 0.5 % injection 50 mLs by Infiltration route once. Prn    . Multiple Vitamins-Minerals (MULTIVITAL PO) Take by mouth.    . Phenazopyridine HCl (AZO TABS PO) Take by mouth.    . temazepam (RESTORIL) 7.5 MG capsule Take 1-3 tablets as needed for sleep 90 capsule 5   No current  facility-administered medications for this visit.    Family History  Problem Relation Age of Onset  . Anemia Father     ROS:  Pertinent items are noted in HPI.  Otherwise, a comprehensive ROS was negative.  Exam:   BP 110/70 mmHg  Pulse 80  Resp 14  Ht 5' 1.75" (1.568 m)  Wt 125 lb (56.7 kg)  BMI 23.06 kg/m2  LMP 07/30/2015 Height: 5' 1.75" (156.8 cm) Ht Readings from Last 3 Encounters:  08/15/15 5' 1.75" (1.568 m)  03/31/15 5' 1.25" (1.556 m)  03/28/15 5' 2.5" (1.588 m)    General appearance: alert, cooperative and appears stated age Head: Normocephalic, without obvious abnormality, atraumatic Neck: no adenopathy, supple, symmetrical, trachea midline and thyroid normal to inspection and palpation Lungs: clear to auscultation bilaterally Breasts: normal appearance, no masses or tenderness, surgical scars from breast reduction Heart: regular rate and rhythm Abdomen: soft, non-tender; no masses,  no organomegaly Extremities: extremities normal, atraumatic, no cyanosis or edema Skin: Skin color, texture, turgor normal. No rashes or lesions Lymph nodes: Cervical, supraclavicular, and axillary nodes normal. No abnormal inguinal nodes palpated Neurologic: Grossly normal   Pelvic: External genitalia:  no lesions              Urethra:  normal appearing urethra with no masses,  tenderness or lesions              Bartholin's and Skene's: normal                 Vagina: normal appearing vagina with normal color and thin yellow discharge, no lesions - Affirm is taken              Cervix: anteverted              Pap taken: No. Bimanual Exam:  Uterus:  normal size, contour, position, consistency, mobility, non-tender              Adnexa: no mass, fullness, tenderness               Rectovaginal: Confirms               Anus:  normal sphincter tone, no lesions  Chaperone present: yes  A:  Well Woman with normal exam  Contraception - none - aware of risk even if low  History of IC  and seen by Dr. McDiarmid R/O UTI - from self applications of Marcaine R/O Yeast vaginitis   P:   Reviewed health and wellness pertinent to exam  Pap smear as above  Mammogram is due now and she will schedule  Will follow with labs, urine culture and Affirm   Counseled on breast self exam, mammography screening, adequate intake of calcium and vitamin D, diet and exercise return annually or prn  An After Visit Summary was printed and given to the patient.  Interpreter with [facility]:  Lottie Mussel

## 2015-08-16 LAB — CBC WITH DIFFERENTIAL/PLATELET
Basophils Absolute: 0 10*3/uL (ref 0.0–0.1)
Basophils Relative: 0 % (ref 0–1)
EOS ABS: 0.2 10*3/uL (ref 0.0–0.7)
Eosinophils Relative: 3 % (ref 0–5)
HCT: 39.3 % (ref 36.0–46.0)
Hemoglobin: 13 g/dL (ref 12.0–15.0)
Lymphocytes Relative: 36 % (ref 12–46)
Lymphs Abs: 2.6 10*3/uL (ref 0.7–4.0)
MCH: 32 pg (ref 26.0–34.0)
MCHC: 33.1 g/dL (ref 30.0–36.0)
MCV: 96.8 fL (ref 78.0–100.0)
MONOS PCT: 8 % (ref 3–12)
MPV: 9.8 fL (ref 8.6–12.4)
Monocytes Absolute: 0.6 10*3/uL (ref 0.1–1.0)
NEUTROS PCT: 53 % (ref 43–77)
Neutro Abs: 3.8 10*3/uL (ref 1.7–7.7)
PLATELETS: 320 10*3/uL (ref 150–400)
RBC: 4.06 MIL/uL (ref 3.87–5.11)
RDW: 13.3 % (ref 11.5–15.5)
WBC: 7.2 10*3/uL (ref 4.0–10.5)

## 2015-08-16 LAB — VITAMIN D 25 HYDROXY (VIT D DEFICIENCY, FRACTURES): Vit D, 25-Hydroxy: 24 ng/mL — ABNORMAL LOW (ref 30–100)

## 2015-08-16 LAB — LIPID PANEL
CHOL/HDL RATIO: 4.3 ratio (ref <=5.0)
CHOLESTEROL: 205 mg/dL — AB (ref 125–200)
HDL: 48 mg/dL (ref >=46)
LDL Cholesterol: 121 mg/dL (ref <130)
Triglycerides: 182 mg/dL — ABNORMAL HIGH (ref <150)
VLDL: 36 mg/dL — ABNORMAL HIGH (ref <30)

## 2015-08-16 LAB — COMPREHENSIVE METABOLIC PANEL
ALT: 5 U/L — AB (ref 6–29)
AST: 17 U/L (ref 10–35)
Albumin: 4.2 g/dL (ref 3.6–5.1)
Alkaline Phosphatase: 46 U/L (ref 33–115)
BILIRUBIN TOTAL: 0.3 mg/dL (ref 0.2–1.2)
BUN: 12 mg/dL (ref 7–25)
CALCIUM: 9.4 mg/dL (ref 8.6–10.2)
CO2: 31 mmol/L (ref 20–31)
CREATININE: 0.72 mg/dL (ref 0.50–1.10)
Chloride: 102 mmol/L (ref 98–110)
GLUCOSE: 65 mg/dL (ref 65–99)
Potassium: 4.5 mmol/L (ref 3.5–5.3)
Sodium: 138 mmol/L (ref 135–146)
Total Protein: 7.5 g/dL (ref 6.1–8.1)

## 2015-08-16 LAB — URINE CULTURE

## 2015-08-16 LAB — TSH: TSH: 0.78 m[IU]/L

## 2015-08-16 LAB — WET PREP BY MOLECULAR PROBE
Candida species: POSITIVE — AB
GARDNERELLA VAGINALIS: NEGATIVE
Trichomonas vaginosis: NEGATIVE

## 2015-08-16 LAB — HIV ANTIBODY (ROUTINE TESTING W REFLEX): HIV 1&2 Ab, 4th Generation: NONREACTIVE

## 2015-08-17 NOTE — Progress Notes (Signed)
Encounter reviewed by Dr. Shakiara Lukic Amundson C. Silva.  

## 2015-08-18 ENCOUNTER — Telehealth: Payer: Self-pay | Admitting: Emergency Medicine

## 2015-08-18 NOTE — Telephone Encounter (Signed)
Notes Recorded by Ria Comment, FNP on 08/16/2015 at 11:40 AM Please let pt know that urine culture does show some bacteria -but contamination is also there because of multiple bacteria. Since she is symptomatic have her to increase po fluids. She also needs to return to see Dr. Perley Jain. We were going to wait on treatment of this before she goes - but OK to call now and get a recheck apt. Please forward a copy of her urine culture on to him. Notes Recorded by Ria Comment, FNP on 08/16/2015 at 8:28 AM Please let pt know tests results: The HIV test is negative as expected. The Vit D is low at 24 - have her to add OTC Vit D at 1000 IU daily. The Wet prep is positive for yeast and Diflucan is sent to the pharmacy. Thyroid, kidney, glucose, and CBC is all normal. The lipid test shows only slight elevated total cholesterol. The triglycerides is elevated and she must work on reduction of cakes, cookies, pies, candy and whipped toppings. The urine culture is not yet back.

## 2015-08-22 ENCOUNTER — Other Ambulatory Visit: Payer: Self-pay | Admitting: Nurse Practitioner

## 2015-08-22 MED ORDER — FLUCONAZOLE 150 MG PO TABS
150.0000 mg | ORAL_TABLET | Freq: Once | ORAL | Status: DC
Start: 2015-08-22 — End: 2015-09-26

## 2015-08-22 NOTE — Telephone Encounter (Addendum)
Call to patient. She declines use of telephone interpretor for results notes.   She is given results of urine culture and will increase fluids. She declines ongoing symptoms, states she feels improved. She declines assistance with follow up appointment with Dr. Sherron Monday. Will send copy of urine culture. Patient verbalized understanding of Yeast on affirm testing and will take Diflucan as directed.   Notified of negative HIV screening and Vitamin D results, she will start OTC Vitamin D 1000 international units daily and diet changes for triglycerides. She was able to repeat back instructions that I gave and is agreeable to call back with any concerns, changes in symptoms or difficulty obtaining appointment with Dr. Sherron Monday.   Routing to provider for final review. Patient agreeable to disposition. Will close encounter.

## 2015-08-22 NOTE — Telephone Encounter (Signed)
You are correct the order was missed. Sorry but it is now placed.

## 2015-08-22 NOTE — Telephone Encounter (Signed)
[name],  Can you review.  I do not see a recent Diflucan Rx

## 2015-08-22 NOTE — Telephone Encounter (Signed)
Patient returned call

## 2015-08-22 NOTE — Telephone Encounter (Signed)
Message left to return call to Erika Scott at [phone].    

## 2015-09-26 ENCOUNTER — Encounter (INDEPENDENT_AMBULATORY_CARE_PROVIDER_SITE_OTHER): Payer: Self-pay

## 2015-09-26 ENCOUNTER — Encounter: Payer: Self-pay | Admitting: Internal Medicine

## 2015-09-26 ENCOUNTER — Ambulatory Visit (INDEPENDENT_AMBULATORY_CARE_PROVIDER_SITE_OTHER): Payer: 59 | Admitting: Internal Medicine

## 2015-09-26 VITALS — BP 118/70 | HR 82 | Ht 62.5 in | Wt 126.6 lb

## 2015-09-26 DIAGNOSIS — F5104 Psychophysiologic insomnia: Secondary | ICD-10-CM

## 2015-09-26 MED ORDER — AMITRIPTYLINE HCL 25 MG PO TABS: 25.0000 mg | ORAL_TABLET | Freq: Every day | ORAL | Status: DC

## 2015-09-26 MED ORDER — TEMAZEPAM 7.5 MG PO CAPS: ORAL_CAPSULE | ORAL | Status: DC

## 2015-09-26 NOTE — Progress Notes (Signed)
05/15/13- 50 yoF never smoker from [place] 24 years ago. Self referral-having trouble sleeping. Always difficulty initiating and maintaining sleep. Can't sleep comfortably with husband-sleeps better and her own bedroom. She moves around a lot. Occasional snore. Avoids all caffeine and naps. Gets home from work about 8 PM. Uses amitriptyline for bladder but afraid of dependence so cuts the pill in fractions. Frequent nocturia. Bedtime 11 PM until 6:30 AM with sleep latency 30-60 minutes. Waking 45 times during the night before up at 7 AM. Rarely uses Xanax. Works as a Sports administrator.  07/06/13- 50 yoF never smoker from [place] 24 years ago. Self referral-having trouble sleeping. FOLLOWS FOR: Pt states that the Temazepam Rx helps when she takes it; she states is scared to take all the time. Using temazepam every 3 or 4 nights. Without it she only sleeps 2 or 3 hours. Amitriptyline now taking whole pill as prescribed. She admits "busy brain"-just can't let go of the issues of the day.  09/24/13-50 yoF never smoker from [place] 24 years ago. Self referral-having trouble sleeping. FOLLOWS FOR:  States medicines work well and she is sleeping, but wanted to come off of them but when she doesn't sleep Occasionally tries to skip temazepam but still needs amitriptyline. To sleep well she needs both. Usually she is okay but still times "busy brain" blamed on job stress.  03/29/14- 50 yoF never smoker from [place] 24 years ago. Self referral-having trouble sleeping. FOLLOW FOR:  Insomnia.  Sleeping has gotten a little better.  Sleeping 4-5 hours nightly.   03/28/15- 50 yoF never smoker from [place] 24 years ago. Self referral-having trouble sleeping. FOLLOWS FOR: pt. states her sleeping has improved but still waking 2-3 times a night everytime. She is sleeping a lot better than she was last year but still wakes 2 or 3 times at night. Sleep disturbance clearly reflects daytime stress from work as discussed on  previous visits. Sometimes difficult to return to sleep. If she is stressed even doubling her temazepam doesn't help. Amitriptyline is continued, but alone is not enough. She does not want flu vaccine.  09/26/2015-50 year old female never smoker from [place] 24 years ago followed for Insomnia, situational stress FOLLOWS FOR: Pt states good and bad days; Pt states if she sleeps good for 1 night then up or not sleepy the next few nights. Has alot of restroom breaks as well.     ROS-see HPI Constitutional:   No-   weight loss, night sweats, fevers, chills,+fatigue, lassitude. HEENT:   No-  headaches, difficulty swallowing, tooth/dental problems, sore throat,       No-  sneezing, itching, ear ache, nasal congestion, post nasal drip,  CV:  No-   chest pain, orthopnea, PND, swelling in lower extremities, anasarca,  dizziness, palpitations Resp: No-   shortness of breath with exertion or at rest.              No-   productive cough,  No non-productive cough,  No- coughing up of blood.              No-   change in color of mucus.  No- wheezing.   Skin: No-   rash or lesions. GI:  No-   heartburn, indigestion, abdominal pain, nausea, vomiting,  GU:  MS:  No-   joint pain or swelling.   Neuro-     nothing unusual Psych:  No- change in mood or affect. No depression + anxiety.  No memory loss.  OBJ- Physical Exam General- Alert,  Oriented, Affect-appropriate, Distress- none acute, looks well Skin- rash-none, lesions- none, excoriation- none Lymphadenopathy- none Head- atraumatic            Eyes- Gross vision intact, PERRLA, conjunctivae and secretions clear            Ears- Hearing, canals-normal            Nose- Clear, no-Septal dev, mucus, polyps, erosion, perforation             Throat- Mallampati II-III , mucosa clear , drainage- none, tonsils- atrophic Neck- flexible , trachea midline, no stridor , thyroid nl, carotid no bruit Chest - symmetrical excursion , unlabored           Heart/CV-  RRR , no murmur , no gallop  , no rub, nl s1 s2                           - JVD- none , edema- none, stasis changes- none, varices- none           Lung- clear to P&A, wheeze- none, cough- none , dullness-none, rub- none           Chest wall-  Abd-  Br/ Gen/ Rectal- Not done, not indicated Extrem- cyanosis- none, clubbing, none, atrophy- none, strength- nl Neuro- grossly intact to observation

## 2015-09-26 NOTE — Patient Instructions (Signed)
Re fill prescriptions printed for amitriptyline and for temazepam  Please call as needed

## 2015-09-28 ENCOUNTER — Telehealth: Payer: Self-pay | Admitting: Certified Nurse Midwife

## 2015-09-28 NOTE — Telephone Encounter (Signed)
Spoke with patient. Patient was last seen on 08/15/2015 for aex with Ria CommentPatricia Grubb, FNP. Patient states that she had a voicemail from Judith Gapracy in our office, but "I think this was an old voicemail." Patient denies any current problems or concerns. There is no open telephone encounters.   Routing to provider for final review. Patient agreeable to disposition. Will close encounter.

## 2015-09-28 NOTE — Telephone Encounter (Signed)
Patient called and said, "I got a call yesterday from your office. I think it may have to do with my recent results and that I may need more testing from the lab." No telephone encounter open. Routing to triage for follow up.

## 2015-11-07 ENCOUNTER — Other Ambulatory Visit: Payer: Self-pay | Admitting: Family

## 2015-11-07 DIAGNOSIS — Z1231 Encounter for screening mammogram for malignant neoplasm of breast: Secondary | ICD-10-CM

## 2015-11-23 MED FILL — SENSORCAINE 0.5% VIAL: 0.5 | 30 days supply | Qty: 250 | Fill #0

## 2016-01-25 ENCOUNTER — Telehealth: Payer: Self-pay | Admitting: Internal Medicine

## 2016-01-25 NOTE — Telephone Encounter (Signed)
Pt is requesting a sooner appt - Supposed to follow up in 72mo with CY Pt currently taking Amitriptyline and Temazepam for sleep - pt feels that the Temazepam is not working 100% Pt would like meds adjusted and OV if needed - pt states that she would prefer an OV so that she can speak with CY herself. Please advise CY if adjustments can be made or when pt can be schedule. Thanks.     Medication List       Accurate as of 01/25/16 11:35 AM. Always use your most recent med list.          amitriptyline 25 MG tablet Commonly known as:  ELAVIL Take 1 tablet (25 mg total) by mouth at bedtime.   cholecalciferol 1000 units tablet Commonly known as:  VITAMIN D Take 1,000 Units by mouth daily.   temazepam 7.5 MG capsule Commonly known as:  RESTORIL Take 1-3 tablets as needed for sleep      No Known Allergies

## 2016-01-26 NOTE — Telephone Encounter (Signed)
We can have patient come in on Monday 01-30-16 at 10:30am(okay to use 15 minute slot). Thanks.

## 2016-01-26 NOTE — Telephone Encounter (Signed)
Spoke with the pt and scheduled appt with CDY for 01/30/16 at 10:30  Nothing further needed

## 2016-01-30 ENCOUNTER — Ambulatory Visit (INDEPENDENT_AMBULATORY_CARE_PROVIDER_SITE_OTHER): Payer: 59 | Admitting: Internal Medicine

## 2016-01-30 ENCOUNTER — Encounter (INDEPENDENT_AMBULATORY_CARE_PROVIDER_SITE_OTHER): Payer: Self-pay

## 2016-01-30 ENCOUNTER — Encounter: Payer: Self-pay | Admitting: Internal Medicine

## 2016-01-30 ENCOUNTER — Telehealth: Payer: Self-pay | Admitting: Nurse Practitioner

## 2016-01-30 DIAGNOSIS — G4700 Insomnia, unspecified: Secondary | ICD-10-CM

## 2016-01-30 DIAGNOSIS — F5104 Psychophysiologic insomnia: Secondary | ICD-10-CM

## 2016-01-30 NOTE — Progress Notes (Signed)
[birth year]- 49 yoF never smoker from [place] 24 years ago. Self referral-having trouble sleeping. Always difficulty initiating and maintaining sleep. Can't sleep comfortably with husband-sleeps better and her own bedroom. She moves around a lot. Occasional snore. Avoids all caffeine and naps. Gets home from work about 8 PM. Uses amitriptyline for bladder but afraid of dependence so cuts the pill in fractions. Frequent nocturia. Bedtime 11 PM until 6:30 AM with sleep latency 30-60 minutes. Waking 45 times during the night before up at 7 AM. Rarely uses Xanax. Works as a Sports administrator.  07/06/13- 18 yoF never smoker from [place] 24 years ago. Self referral-having trouble sleeping. FOLLOWS FOR: Pt states that the Temazepam Rx helps when she takes it; she states is scared to take all the time. Using temazepam every 3 or 4 nights. Without it she only sleeps 2 or 3 hours. Amitriptyline now taking whole pill as prescribed. She admits "busy brain"-just can't let go of the issues of the day.  09/24/13-47 yoF never smoker from [place] 24 years ago. Self referral-having trouble sleeping. FOLLOWS FOR:  States medicines work well and she is sleeping, but wanted to come off of them but when she doesn't sleep Occasionally tries to skip temazepam but still needs amitriptyline. To sleep well she needs both. Usually she is okay but still times "busy brain" blamed on job stress.  03/29/14- 48 yoF never smoker from [place] 24 years ago. Self referral-having trouble sleeping. FOLLOW FOR:  Insomnia.  Sleeping has gotten a little better.  Sleeping 4-5 hours nightly.   03/28/15- 87 yoF never smoker from [place] 24 years ago. Self referral-having trouble sleeping. FOLLOWS FOR: pt. states her sleeping has improved but still waking 2-3 times a night everytime. She is sleeping a lot better than she was last year but still wakes 2 or 3 times at night. Sleep disturbance clearly reflects daytime stress from work as discussed on  previous visits. Sometimes difficult to return to sleep. If she is stressed even doubling her temazepam doesn't help. Amitriptyline is continued, but alone is not enough. She does not want flu vaccine.  09/26/2015-50 year old female never smoker from [place] 24 years ago followed for Insomnia, situational stress FOLLOWS FOR: Pt states good and bad days; Pt states if she sleeps good for 1 night then up or not sleepy the next few nights. Has alot of restroom breaks as well.   01/30/2016-50 year old female never smoker from [place] 24 years ago, followed for Insomnia-situational stress FOLLOWS FOR: Pt states she has good and bad days with sleep. Pt states if she has a bad day then she has much harder time sleeping. even with medication. She is now perimenopausal which complicates her sleep situation. The primary pattern is that any sort of stress during her day carries over to nighttime. On those nights even 3 temazepam aren't sufficient. She then feels tired the next day and worried because she hasn't slept. She has continued amitriptyline 25 mg.   ROS-see HPI Constitutional:   No-   weight loss, night sweats, fevers, chills,+fatigue, lassitude. HEENT:   No-  headaches, difficulty swallowing, tooth/dental problems, sore throat,       No-  sneezing, itching, ear ache, nasal congestion, post nasal drip,  CV:  No-   chest pain, orthopnea, PND, swelling in lower extremities, anasarca,  dizziness, palpitations Resp: No-   shortness of breath with exertion or at rest.              No-   productive cough,  No  non-productive cough,  No- coughing up of blood.              No-   change in color of mucus.  No- wheezing.   Skin: No-   rash or lesions. GI:  No-   heartburn, indigestion, abdominal pain, nausea, vomiting,  GU:  MS:  No-   joint pain or swelling.   Neuro-     nothing unusual Psych:  No- change in mood or affect. No depression + anxiety.  No memory loss.  OBJ- Physical Exam General- Alert,  Oriented, Affect-appropriate, Distress- none acute, looks well Skin- rash-none, lesions- none, excoriation- none Lymphadenopathy- none Head- atraumatic            Eyes- Gross vision intact, PERRLA, conjunctivae and secretions clear            Ears- Hearing, canals-normal            Nose- Clear, no-Septal dev, mucus, polyps, erosion, perforation             Throat- Mallampati II-III , mucosa clear , drainage- none, tonsils- atrophic Neck- flexible , trachea midline, no stridor , thyroid nl, carotid no bruit Chest - symmetrical excursion , unlabored           Heart/CV- RRR , no murmur , no gallop  , no rub, nl s1 s2                           - JVD- none , edema- none, stasis changes- none, varices- none           Lung- clear to P&A, wheeze- none, cough- none , dullness-none, rub- none           Chest wall-  Abd-  Br/ Gen/ Rectal- Not done, not indicated Extrem- cyanosis- none, clubbing, none, atrophy- none, strength- nl Neuro- grossly intact to observation

## 2016-01-30 NOTE — Telephone Encounter (Signed)
Patient is having issues with her cycle. She would like to come in for an appointment.

## 2016-01-30 NOTE — Patient Instructions (Signed)
If you can tell at bedtime that you are going to have difficulty sleeping some nights, then try taking 2 of the amitriptyline ( total of 50 mg) instead of one.  Ok to continue also using up to 3 temazepam capsules, if needed  Please call if we can help

## 2016-01-30 NOTE — Telephone Encounter (Addendum)
Spoke with patient. Patient states that she has been having irregular cycles. Reports she was due to start her menses and has not. States that her cycles have been coming further apart over the last few months. She is having increased hot flashes. Denies any heavy bleeding. Denies any concerns for pregnany. Requesting to be seen in the office with Ria CommentPatricia Grubb, FNP for evaluation. Appointment scheduled for 01/31/2016 at 8:30 am with Ria CommentPatricia Grubb, FNP. She is agreeable to date and time.  Routing to provider for final review. Patient agreeable to disposition. Will close encounter.

## 2016-01-31 ENCOUNTER — Ambulatory Visit (INDEPENDENT_AMBULATORY_CARE_PROVIDER_SITE_OTHER): Payer: 59 | Admitting: Nurse Practitioner

## 2016-01-31 ENCOUNTER — Encounter: Payer: Self-pay | Admitting: Nurse Practitioner

## 2016-01-31 VITALS — BP 100/62 | HR 64 | Ht 61.75 in | Wt 123.0 lb

## 2016-01-31 DIAGNOSIS — N951 Menopausal and female climacteric states: Secondary | ICD-10-CM

## 2016-01-31 DIAGNOSIS — N760 Acute vaginitis: Secondary | ICD-10-CM | POA: Diagnosis not present

## 2016-01-31 DIAGNOSIS — R300 Dysuria: Secondary | ICD-10-CM | POA: Diagnosis not present

## 2016-01-31 DIAGNOSIS — N926 Irregular menstruation, unspecified: Secondary | ICD-10-CM

## 2016-01-31 LAB — POCT URINALYSIS DIPSTICK
BILIRUBIN UA: NEGATIVE
Glucose, UA: NEGATIVE
Ketones, UA: NEGATIVE
NITRITE UA: NEGATIVE
PH UA: 5
Protein, UA: NEGATIVE
Urobilinogen, UA: NEGATIVE

## 2016-01-31 LAB — POCT URINE PREGNANCY: Preg Test, Ur: NEGATIVE

## 2016-01-31 NOTE — Patient Instructions (Signed)

## 2016-01-31 NOTE — Progress Notes (Signed)
Reviewed personally.  M. Suzanne Wendy Mikles, MD.  

## 2016-01-31 NOTE — Progress Notes (Signed)
50 y.o. Married Asian female [id number] here for consult about perimenopause.  She had a normal menses in June and again on 7/28.  Cycles last 3 days.  Increase amount of night sweats but for the most part tolerable.  She does take Restoril at night.  Sometimes still feels tired the next day.  She is having some memory issues as well.  She is able to do her job and stay focussed but often has a hard time with remembering little things.  She feels that the one thing that is making her frustrated is the mood swings.  She feels tense and short tempered. She has history of IC and most recently feels that she is having pressure in vagina or bladder.  Denies vaginal discharge and odor.  She has used Marcaine injections in the past for treatment of IC.  Has not had to do that recently.   O: Healthy WD,WN female Affect: normal Abdomen:soft, non tender, normal bowel sounds Pelvic exam:EXTERNAL GENITALIA: normal appearing vulva with no masses, tenderness or lesions VAGINA: no abnormal discharge or lesions but Affirm is done CERVIX: no lesions or cervical motion tenderness UTERUS: normal ADNEXA: no masses palpable POC urine: moderate RBC and leuk's  UPT: negative  A: Perimenopausal symptoms with still regular menses  Mood changes associated with perimenopause   P:  Discussed the urine results and will send for C&S  Will also follow with Affirm test  She will go to the pharmacy and get Estroven to see if helpful with her moods  She will return in a month and discuss as she may need to be on Lexapro   Labs urine C&S, micro urine, Affirm  Instructions given regarding:  RV

## 2016-02-01 ENCOUNTER — Other Ambulatory Visit: Payer: Self-pay | Admitting: Nurse Practitioner

## 2016-02-01 LAB — WET PREP BY MOLECULAR PROBE
CANDIDA SPECIES: POSITIVE — AB
GARDNERELLA VAGINALIS: NEGATIVE
TRICHOMONAS VAG: NEGATIVE

## 2016-02-01 LAB — URINALYSIS, MICROSCOPIC ONLY
Bacteria, UA: NONE SEEN [HPF]
CASTS: NONE SEEN [LPF]
Crystals: NONE SEEN [HPF]
YEAST: NONE SEEN [HPF]

## 2016-02-01 MED ORDER — FLUCONAZOLE 150 MG PO TABS: 150.0000 mg | ORAL_TABLET | Freq: Once | ORAL | 0 refills | Status: AC

## 2016-02-02 LAB — URINE CULTURE: Organism ID, Bacteria: NO GROWTH

## 2016-02-12 NOTE — Assessment & Plan Note (Signed)
We are going to try continuing to work with the medications she is used to, giving permission to increase to 50 mg amitriptyline at bedtime in addition to the temazepam. She looks younger than stated age but has been in this [place] years. I would've expected her English to be a little better and I wonder what that says about cultural isolation as a component of her stress issues

## 2016-03-05 MED FILL — SENSORCAINE 0.5% VIAL: 0.5 | 30 days supply | Qty: 250 | Fill #1

## 2016-03-12 ENCOUNTER — Ambulatory Visit: Payer: 59 | Admitting: Nurse Practitioner

## 2016-03-19 ENCOUNTER — Ambulatory Visit (INDEPENDENT_AMBULATORY_CARE_PROVIDER_SITE_OTHER): Payer: 59 | Admitting: Nurse Practitioner

## 2016-03-19 ENCOUNTER — Encounter: Payer: Self-pay | Admitting: Nurse Practitioner

## 2016-03-19 VITALS — BP 100/64 | HR 72 | Ht 61.75 in | Wt 123.0 lb

## 2016-03-19 DIAGNOSIS — N951 Menopausal and female climacteric states: Secondary | ICD-10-CM | POA: Diagnosis not present

## 2016-03-19 NOTE — Progress Notes (Signed)
50 y.o. Married Asian female [id number] here for consult visit to follow up on perimenopausal symptoms.  LMP:  02/27/16 that was 3 days.  Did not get OTC Estroven but moods are better.  Less anxious, less moody.  Vaso symptoms are now only 1 week before cycle then goes away.   No other problems with bladder or vaginal odor. Using extra lubrication with SA.   She is able to do her job and stay focussed much better since her sleep has improved.  PCP is giving her Restoril.  She has history of IC and most recently feels that she is having pressure in vagina or bladder.  Denies vaginal discharge and odor.  She has used Marcaine injections in the past for treatment of IC.  Has not had to do that recently.   O: Healthy WD,WN female Affect: normal, less tense Feels better in general    A: Perimenopausal    History of insomnia    P:  Discussed perimenopausal symptoms and mood changes.  Since she is better I do not believe that we need to add other medication.    Instructions given regarding: if prolonged bleeding or missed cycles to call.  AEX in 07/2016. patient declines interpreter today. Consult 15 minutes face to face.  RV

## 2016-03-19 NOTE — Patient Instructions (Signed)
Perimenopause Perimenopause is the time when your body begins to move into the menopause (no menstrual period for 12 straight months). It is a natural process. Perimenopause can begin 2-8 years before the menopause and usually lasts for 1 year after the menopause. During this time, your ovaries may or may not produce an egg. The ovaries vary in their production of estrogen and progesterone hormones each month. This can cause irregular menstrual periods, difficulty getting pregnant, vaginal bleeding between periods, and uncomfortable symptoms. CAUSES  Irregular production of the ovarian hormones, estrogen and progesterone, and not ovulating every month.  Other causes include:  Tumor of the pituitary gland in the brain.  Medical disease that affects the ovaries.  Radiation treatment.  Chemotherapy.  Unknown causes.  Heavy smoking and excessive alcohol intake can bring on perimenopause sooner. SIGNS AND SYMPTOMS   Hot flashes.  Night sweats.  Irregular menstrual periods.  Decreased sex drive.  Vaginal dryness.  Headaches.  Mood swings.  Depression.  Memory problems.  Irritability.  Tiredness.  Weight gain.  Trouble getting pregnant.  The beginning of losing bone cells (osteoporosis).  The beginning of hardening of the arteries (atherosclerosis). DIAGNOSIS  Your health care provider will make a diagnosis by analyzing your age, menstrual history, and symptoms. He or she will do a physical exam and note any changes in your body, especially your female organs. Female hormone tests may or may not be helpful depending on the amount of female hormones you produce and when you produce them. However, other hormone tests may be helpful to rule out other problems. TREATMENT  In some cases, no treatment is needed. The decision on whether treatment is necessary during the perimenopause should be made by you and your health care provider based on how the symptoms are affecting you  and your lifestyle. Various treatments are available, such as:  Treating individual symptoms with a specific medicine for that symptom.  Herbal medicines that can help specific symptoms.  Counseling.  Group therapy. HOME CARE INSTRUCTIONS   Keep track of your menstrual periods (when they occur, how heavy they are, how long between periods, and how long they last) as well as your symptoms and when they started.  Only take over-the-counter or prescription medicines as directed by your health care provider.  Sleep and rest.  Exercise.  Eat a diet that contains calcium (good for your bones) and soy (acts like the estrogen hormone).  Do not smoke.  Avoid alcoholic beverages.  Take vitamin supplements as recommended by your health care provider. Taking vitamin E may help in certain cases.  Take calcium and vitamin D supplements to help prevent bone loss.  Group therapy is sometimes helpful.  Acupuncture may help in some cases. SEEK MEDICAL CARE IF:   You have questions about any symptoms you are having.  You need a referral to a specialist (gynecologist, psychiatrist, or psychologist). SEEK IMMEDIATE MEDICAL CARE IF:   You have vaginal bleeding.  Your period lasts longer than 8 days.  Your periods are recurring sooner than 21 days.  You have bleeding after intercourse.  You have severe depression.  You have pain when you urinate.  You have severe headaches.  You have vision problems.   This information is not intended to replace advice given to you by your health care provider. Make sure you discuss any questions you have with your health care provider.   Document Released: 07/19/2004 Document Revised: 07/02/2014 Document Reviewed: 01/08/2013 Elsevier Interactive Patient Education [employer].  May have irregular menses or no bleeding - if prolonged bleeding to call.

## 2016-03-22 NOTE — Progress Notes (Signed)
Encounter reviewed by Dr. Amahd Morino Amundson C. Silva.  

## 2016-03-25 ENCOUNTER — Other Ambulatory Visit: Payer: Self-pay | Admitting: Internal Medicine

## 2016-03-27 NOTE — Telephone Encounter (Signed)
CY Please advise on refill. Thanks.  

## 2016-03-27 NOTE — Telephone Encounter (Signed)
Ok refill 6 months 

## 2016-04-02 ENCOUNTER — Encounter: Payer: Self-pay | Admitting: Internal Medicine

## 2016-04-02 ENCOUNTER — Ambulatory Visit (INDEPENDENT_AMBULATORY_CARE_PROVIDER_SITE_OTHER): Payer: 59 | Admitting: Internal Medicine

## 2016-04-02 DIAGNOSIS — F5104 Psychophysiologic insomnia: Secondary | ICD-10-CM

## 2016-04-02 MED ORDER — TEMAZEPAM 7.5 MG PO CAPS: ORAL_CAPSULE | ORAL | 5 refills | Status: DC

## 2016-04-02 MED ORDER — AMITRIPTYLINE HCL 25 MG PO TABS: ORAL_TABLET | ORAL | 5 refills | Status: DC

## 2016-04-02 NOTE — Progress Notes (Signed)
HPI  F never smoker from [place] 24 years ago, married, followed for chronic insomnia   01/30/2016-50 year old female never smoker from [place] 24 years ago, followed for Insomnia-situational stress FOLLOWS FOR: Pt states she has good and bad days with sleep. Pt states if she has a bad day then she has much harder time sleeping. even with medication. She is now perimenopausal which complicates her sleep situation. The primary pattern is that any sort of stress during her day carries over to nighttime. On those nights even 3 temazepam aren't sufficient. She then feels tired the next day and worried because she hasn't slept. She has continued amitriptyline 25 mg.  04/13/2016-50 year old female never smoker from [place] 24 years ago followed for insomnia-situational stress We increased amitriptyline to 50 mg at bedtime, with temazepam FOLLOWS FOR: Pt states she ususally sleeps well but when stressed from normal things then she has hard time sleeping-avg sleeping time is about 6 hours. Even when taking meds she cant sleep when stressed out. She continues to describe her sleep situation the same way. If she is under pressure at work, trying to make tight schedule this etc., then she sleeps poorly that night and is tired the next day. She does not recognize sedative carryover from her medications which she now takes every night. They definitely help. I'm recommending she see a counselor to help her manage job stress.   ROS-see HPI Constitutional:   No-   weight loss, night sweats, fevers, chills,+fatigue, lassitude. HEENT:   No-  headaches, difficulty swallowing, tooth/dental problems, sore throat,       No-  sneezing, itching, ear ache, nasal congestion, post nasal drip,  CV:  No-   chest pain, orthopnea, PND, swelling in lower extremities, anasarca,  dizziness, palpitations Resp: No-   shortness of breath with exertion or at rest.              No-   productive cough,  No non-productive cough,  No-  coughing up of blood.              No-   change in color of mucus.  No- wheezing.   Skin: No-   rash or lesions. GI:  No-   heartburn, indigestion, abdominal pain, nausea, vomiting,  GU:  MS:  No-   joint pain or swelling.   Neuro-     nothing unusual Psych:  No- change in mood or affect. No depression + anxiety.  No memory loss.  OBJ- Physical Exam General- Alert, Oriented, Affect-appropriate, Distress- none acute, looks well Skin- rash-none, lesions- none, excoriation- none Lymphadenopathy- none Head- atraumatic            Eyes- Gross vision intact, PERRLA, conjunctivae and secretions clear            Ears- Hearing, canals-normal            Nose- Clear, no-Septal dev, mucus, polyps, erosion, perforation             Throat- Mallampati II-III , mucosa clear , drainage- none, tonsils- atrophic Neck- flexible , trachea midline, no stridor , thyroid nl, carotid no bruit Chest - symmetrical excursion , unlabored           Heart/CV- RRR , no murmur , no gallop  , no rub, nl s1 s2                           - JVD- none , edema- none, stasis  changes- none, varices- none           Lung- clear to P&A, wheeze- none, cough- none , dullness-none, rub- none           Chest wall-  Abd-  Br/ Gen/ Rectal- Not done, not indicated Extrem- cyanosis- none, clubbing, none, atrophy- none, strength- nl Neuro- grossly intact to observation

## 2016-04-02 NOTE — Assessment & Plan Note (Signed)
Medications are appropriate. Her primary issue is her job stress which aggravates her long-term tendency to insomnia. It is not appropriate for me to increase medication doses. She needs a counselor to help her deal with job stress. [facility]

## 2016-04-02 NOTE — Patient Instructions (Signed)
Sleep medicine scripts refilled  It may be that a counselor could help you deal better with the stress of work, since that seems to be what carries over to make it hard for you to sleep at night.

## 2016-05-09 ENCOUNTER — Encounter (HOSPITAL_COMMUNITY): Payer: Self-pay

## 2016-05-09 ENCOUNTER — Emergency Department (HOSPITAL_COMMUNITY): Payer: 59

## 2016-05-09 ENCOUNTER — Emergency Department (HOSPITAL_COMMUNITY)
Admission: EM | Admit: 2016-05-09 | Discharge: 2016-05-09 | Disposition: A | Discharge status: Home / Self Care | Payer: 59 | Attending: Emergency Medicine | Admitting: Emergency Medicine

## 2016-05-09 DIAGNOSIS — Z79899 Other long term (current) drug therapy: Secondary | ICD-10-CM | POA: Insufficient documentation

## 2016-05-09 DIAGNOSIS — R079 Chest pain, unspecified: Secondary | ICD-10-CM | POA: Diagnosis present

## 2016-05-09 DIAGNOSIS — R0789 Other chest pain: Secondary | ICD-10-CM | POA: Diagnosis not present

## 2016-05-09 LAB — CBC
HCT: 38 % (ref 36.0–46.0)
HEMOGLOBIN: 12.9 g/dL (ref 12.0–15.0)
MCH: 32.5 pg (ref 26.0–34.0)
MCHC: 33.9 g/dL (ref 30.0–36.0)
MCV: 95.7 fL (ref 78.0–100.0)
Platelets: 299 10*3/uL (ref 150–400)
RBC: 3.97 MIL/uL (ref 3.87–5.11)
RDW: 13.3 % (ref 11.5–15.5)
WBC: 7.2 10*3/uL (ref 4.0–10.5)

## 2016-05-09 LAB — BASIC METABOLIC PANEL
ANION GAP: 6 (ref 5–15)
BUN: 9 mg/dL (ref 6–20)
CALCIUM: 8.8 mg/dL — AB (ref 8.9–10.3)
CO2: 27 mmol/L (ref 22–32)
Chloride: 106 mmol/L (ref 101–111)
Creatinine, Ser: 0.64 mg/dL (ref 0.44–1.00)
Glucose, Bld: 92 mg/dL (ref 65–99)
Potassium: 4.2 mmol/L (ref 3.5–5.1)
Sodium: 139 mmol/L (ref 135–145)

## 2016-05-09 LAB — I-STAT TROPONIN, ED: TROPONIN I, POC: 0 ng/mL (ref 0.00–0.08)

## 2016-05-09 LAB — D-DIMER, QUANTITATIVE (NOT AT ARMC)

## 2016-05-09 MED ORDER — ACETAMINOPHEN 325 MG PO TABS
650.0000 mg | ORAL_TABLET | Freq: Once | ORAL | Status: AC
  Administered 2016-05-09: 650 mg via ORAL
  Filled 2016-05-09: qty 2

## 2016-05-09 MED ORDER — IBUPROFEN 200 MG PO TABS: 400.0000 mg | ORAL_TABLET | Freq: Once | ORAL | Status: DC

## 2016-05-09 MED ORDER — CYCLOBENZAPRINE HCL 10 MG PO TABS
5.0000 mg | ORAL_TABLET | Freq: Once | ORAL | Status: AC
  Administered 2016-05-09: 5 mg via ORAL
  Filled 2016-05-09: qty 1

## 2016-05-09 MED ORDER — CYCLOBENZAPRINE HCL 5 MG PO TABS: 5.0000 mg | ORAL_TABLET | Freq: Three times a day (TID) | ORAL | 0 refills | Status: DC | PRN

## 2016-05-09 NOTE — ED Notes (Signed)
Bed: [id number] Expected date:  Expected time:  Means of arrival:  Comments: For [employer]

## 2016-05-09 NOTE — ED Provider Notes (Signed)
[facility]-EMERGENCY DEPT Provider Note   CSN: [id number]  Arrival date & time: 05/09/16  1555     History   Chief Complaint Chief Complaint  Patient presents with  . Chest Pain    HPI Erika Scott is a 50 y.o. female.  Erika BernardHa Krass is a 50 y.o. female with h/o insomnia and anxiety presents to ED with complaint of chest pain. Patient reports she initially started having right shoulder and neck pain approximately 2 days ago. She reports today onset of left-sided chest pain. Pain is intermittent, 4/10, non-radiating, described as sharp. She denies worsening of pain with palpation, movement, or deep inspiration. She denies fever, diaphoresis, SOB, cough, leg swelling/pain, abdominal pain, N/V, syncope, or numbness/weakness. No treatments tried PTA. She denies recent long distance travel/surgery/hospitalization, h/o blood clot, h/o cancer, hormone use, or hemoptysis. She denies any cardiac h/o, HTN, high cholesterol, or diabetes. She does not smoke. 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    The history is provided by the patient.  Chest Pain   Pertinent negatives include no abdominal pain, no cough, no diaphoresis, no fever, no nausea, no numbness, no palpitations, no shortness of breath, no vomiting and no weakness.    Past Medical History:  Diagnosis Date  . Anxiety     Patient Active Problem List   Diagnosis Date Noted  . Chronic insomnia 05/31/2013    Past Surgical History:  Procedure Laterality Date  . BREAST ENHANCEMENT SURGERY Bilateral 10 years ago   Breast Lift    OB History    Gravida Para Term Preterm AB Living   3 2 2  0 1 2   SAB TAB Ectopic Multiple Live Births   0 1 0 0 2       Home Medications    Prior to Admission medications   Medication Sig Start Date End Date Taking? Authorizing Provider  amitriptyline (ELAVIL) 25 MG tablet 1 or 2 at bedtime 04/02/16   Waymon Budgelinton D Young,  MD  cholecalciferol (VITAMIN D) 1000 units tablet Take 1,000 Units by mouth daily.    Historical Provider, MD  cyclobenzaprine (FLEXERIL) 5 MG tablet Take 1 tablet (5 mg total) by mouth 3 (three) times daily as needed for muscle spasms. 05/09/16   Lona KettleAshley Laurel Meyer, PA-C  temazepam (RESTORIL) 7.5 MG capsule take 1 to 3 tablet by mouth if needed for sleep 04/02/16   Waymon Budgelinton D Young, MD    Family History Family History  Problem Relation Age of Onset  . Anemia Father     Social History Social History  Substance Use Topics  . Smoking status: Never Smoker  . Smokeless tobacco: Never Used  . Alcohol use No     Allergies   Patient has no known allergies.   Review of Systems Review of Systems  Constitutional: Negative for chills, diaphoresis and fever.  HENT: Negative for trouble swallowing.   Eyes: Negative for visual disturbance.  Respiratory: Negative for cough and shortness of breath.   Cardiovascular: Positive for chest pain. Negative for palpitations and leg swelling.  Gastrointestinal: Negative for abdominal pain, nausea and vomiting.  Genitourinary: Negative for dysuria and hematuria.  Musculoskeletal: Positive for arthralgias and neck pain.  Skin: Negative for rash.  Neurological: Negative for syncope, weakness and numbness.     Physical Exam Updated Vital Signs BP 117/89   Pulse 64   Resp 18   SpO2  100%   Physical Exam  Constitutional: She appears well-developed and well-nourished. No distress.  HENT:  Head: Normocephalic and atraumatic.  Mouth/Throat: Oropharynx is clear and moist. No oropharyngeal exudate.  Eyes: Conjunctivae and EOM are normal. Pupils are equal, round, and reactive to light. Right eye exhibits no discharge. Left eye exhibits no discharge. No scleral icterus.  Neck: Normal range of motion and phonation normal. Neck supple. No neck rigidity. Normal range of motion present.  Cardiovascular: Normal rate, regular rhythm, normal heart sounds and  intact distal pulses.   No murmur heard. Pulmonary/Chest: Effort normal and breath sounds normal. No stridor. No respiratory distress. She has no wheezes. She has no rales. She exhibits tenderness.    Reproducible TTP of left anterior chest wall.   Abdominal: Soft. Bowel sounds are normal. She exhibits no distension. There is no tenderness. There is no rigidity, no rebound, no guarding and no CVA tenderness.  Musculoskeletal: Normal range of motion.  Lymphadenopathy:    She has no cervical adenopathy.  Neurological: She is alert. She has normal strength. She is not disoriented. She exhibits normal muscle tone. Coordination and gait normal. GCS eye subscore is 4. GCS verbal subscore is 5. GCS motor subscore is 6.  Patient moves all extremities with ease.   Skin: Skin is warm and dry. She is not diaphoretic.  Psychiatric: She has a normal mood and affect. Her behavior is normal.     ED Treatments / Results  Labs (all labs ordered are listed, but only abnormal results are displayed) Labs Reviewed  BASIC METABOLIC PANEL - Abnormal; Notable for the following:       Result Value   Calcium 8.8 (*)    All other components within normal limits  CBC  D-DIMER, QUANTITATIVE (NOT AT [facility])  Rosezena Sensor, ED    EKG  EKG Interpretation  Date/Time:  Wednesday May 09 2016 16:04:40 EST Ventricular Rate:  70 PR Interval:    QRS Duration: 105 QT Interval:  384 QTC Calculation: 415 R Axis:   73 Text Interpretation:  Sinus rhythm Low voltage, precordial leads RSR' in V1 or V2, right VCD or RVH Minimal ST elevation, inferior leads Since last tracing of earlier today No significant change was found Confirmed by Effie Shy  MD, ELLIOTT [phone]) on 05/09/2016 8:17:33 PM       Radiology Dg Chest 2 View  Result Date: 05/09/2016 CLINICAL DATA:  Chest, neck and right shoulder discomfort for 3 days. EXAM: CHEST  2 VIEW COMPARISON:  06/12/2014 FINDINGS: The cardiac silhouette, mediastinal and  hilar contours are within normal limits and stable. The lungs are clear. No pleural effusion. The bony thorax is intact. IMPRESSION: No acute cardiopulmonary findings. Electronically Signed   By: Rudie Meyer M.D.   On: 05/09/2016 16:31    Procedures Procedures (including critical care time)  Medications Ordered in ED Medications  acetaminophen (TYLENOL) tablet 650 mg (650 mg Oral Given 05/09/16 1827)  cyclobenzaprine (FLEXERIL) tablet 5 mg (5 mg Oral Given 05/09/16 1827)     Initial Impression / Assessment and Plan / ED Course  I have reviewed the triage vital signs and the nursing notes.  Pertinent labs & imaging results that were available during my care of the patient were reviewed by me and considered in my medical decision making (see chart for details).  Clinical Course as of May 09 2029  Wed May 09, 2016  1700 Normal cardiac silhouette. No evidence of consolidation, effusion, or PTX. No free air under diaphragm.  DG Chest 2 View [AM]    Clinical Course User Index [AM] Lona KettleAshley Laurel Meyer, PA-C    Patient presents to ED with complaint of left sided chest pain onset today. Patient is afebrile (temp 98.7) and non-toxic appearing in NAD. VSS. Physical exam remarkable for reproducible left sided anterior chest wall pain to palpation. Lungs are CTABL. Heart RRR. Intact distal pulses. No lower extremity swelling. Abdomen is soft, non-distended, and non-tender. Labs grossly nml. CXR nml. Troponin normal. EKG shows no significant change from previous. Heart score 1. Low suspicion for ACS. Well's 0, d-dimer normal, low suspicion for PE. Suspect MSK as pain is reproducible and patient endorses doing a lot of recent lifting with upper body.    On re-evaluation patient endorses improvement and is requesting to go home. Discussed results and plan with patient. Rx flexeril. Symptomatic management discussed. Follow up with PCP within one week for re-evaluation. Return precautions given. Pt  voiced understanding and is agreeable.   Final Clinical Impressions(s) / ED Diagnoses   Final diagnoses:  Chest wall pain    New Prescriptions New Prescriptions   CYCLOBENZAPRINE (FLEXERIL) 5 MG TABLET    Take 1 tablet (5 mg total) by mouth 3 (three) times daily as needed for muscle spasms.     Lona KettleAshley Laurel Meyer, PA-C 05/09/16 2030    Mancel BaleElliott Wentz, MD 05/10/16 0001

## 2016-05-09 NOTE — Discharge Instructions (Signed)
Read the information below.  Your labs, EKG, and imaging were re-assuring. This may be muscle pain that you are experiencing. You can take tylenol 650mg  every 6hrs or motrin 400mg  every 6hrs for pain relief. I have also prescribed a muscle relaxer called flexeril. This medication can make you drowsy, do not drive after taking.  Use the prescribed medication as directed.  Please discuss all new medications with your pharmacist.   Be sure to follow up with your regular doctor within one week for re-evaluation.  You may return to the Emergency Department at any time for worsening condition or any new symptoms that concern you. Return to ED if you develop worsening chest pain, shortness of breath, coughing up blood, loss of consciousness, or any other new/concerning symptoms.

## 2016-05-09 NOTE — ED Triage Notes (Signed)
She tells me that she has had right neck/shoulder and arm discomfort x 3 days. She states this right-sided pain persists; and that additionally today she is having left-sided chest/breast area discomfort. She denies fever/cough/shortness of breath and is in no distress. EKG performed at triage.

## 2016-05-12 ENCOUNTER — Other Ambulatory Visit: Payer: Self-pay | Admitting: Internal Medicine

## 2016-05-14 NOTE — Telephone Encounter (Signed)
CY Please advise on refill. Thanks.  

## 2016-05-14 NOTE — Telephone Encounter (Signed)
Ok to refill 6 months 

## 2016-06-29 MED FILL — BUPIVACAINE HCL 0.5 % SOLN: 0.5 | 30 days supply | Qty: 250 | Fill #2

## 2016-08-20 ENCOUNTER — Ambulatory Visit: Payer: 59 | Admitting: Nurse Practitioner

## 2016-08-20 ENCOUNTER — Encounter: Payer: Self-pay | Admitting: Nurse Practitioner

## 2016-08-20 NOTE — Progress Notes (Deleted)
51 y.o. [id number] Married  [place] here for annual exam.    No LMP recorded.          Sexually active: {yes no:314532}  The current method of family planning is {contraception:315051}.    Exercising: {yes no:314532}  {types:19826} Smoker:  {YES NO:22349}  Health Maintenance: Pap:  08-07-13 WNL NEG HR HPV MMG:  04-19-14 WNL  Colonoscopy:  *** BMD:   *** TDaP:  *** HIV: 08-15-15 -NEG Labs: ***   reports that she has never smoked. She has never used smokeless tobacco. She reports that she does not drink alcohol or use drugs.  Past Medical History:  Diagnosis Date  . Anxiety     Past Surgical History:  Procedure Laterality Date  . BREAST ENHANCEMENT SURGERY Bilateral 10 years ago   Breast Lift    Current Outpatient Prescriptions  Medication Sig Dispense Refill  . amitriptyline (ELAVIL) 25 MG tablet 1 or 2 at bedtime 30 tablet 5  . cholecalciferol (VITAMIN D) 1000 units tablet Take 1,000 Units by mouth daily.    . cyclobenzaprine (FLEXERIL) 5 MG tablet Take 1 tablet (5 mg total) by mouth 3 (three) times daily as needed for muscle spasms. 21 tablet 0  . temazepam (RESTORIL) 7.5 MG capsule take 1 to 3 tablet by mouth if needed for sleep 90 capsule 5   No current facility-administered medications for this visit.     Family History  Problem Relation Age of Onset  . Anemia Father     ROS:  Pertinent items are noted in HPI.  Otherwise, a comprehensive ROS was negative.  Exam:   There were no vitals taken for this visit.   Ht Readings from Last 3 Encounters:  04/02/16 5' 2.5" (1.588 m)  03/19/16 5' 1.75" (1.568 m)  01/31/16 5' 1.75" (1.568 m)    General appearance: alert, cooperative and appears stated age Head: Normocephalic, without obvious abnormality, atraumatic Neck: no adenopathy, supple, symmetrical, trachea midline and thyroid {EXAM; THYROID:18604} Lungs: clear to auscultation bilaterally Breasts: {Exam; breast:13139::"normal appearance, no masses or  tenderness"} Heart: regular rate and rhythm Abdomen: soft, non-tender; no masses,  no organomegaly Extremities: extremities normal, atraumatic, no cyanosis or edema Skin: Skin color, texture, turgor normal. No rashes or lesions Lymph nodes: Cervical, supraclavicular, and axillary nodes normal. No abnormal inguinal nodes palpated Neurologic: Grossly normal   Pelvic: External genitalia:  no lesions              Urethra:  normal appearing urethra with no masses, tenderness or lesions              Bartholin's and Skene's: normal                 Vagina: normal appearing vagina with normal color and discharge, no lesions              Cervix: {exam; cervix:14595}              Pap taken: {yes no:314532} Bimanual Exam:  Uterus:  {exam; uterus:12215}              Adnexa: {exam; adnexa:12223}               Rectovaginal: Confirms               Anus:  normal sphincter tone, no lesions  Chaperone present: ***  A:  Well Woman with normal exam  Contraception - none - aware of risk even if low  History of IC and seen by Dr. McDiarmid self applications of Marcaine    P:   Reviewed health and wellness pertinent to exam  Pap smear as above  {plan; gyn:5269::"mammogram","pap smear","return annually or prn"}  An After Visit Summary was printed and given to the patient.

## 2016-09-27 ENCOUNTER — Other Ambulatory Visit: Payer: Self-pay | Admitting: Internal Medicine

## 2016-09-27 NOTE — Telephone Encounter (Signed)
Ok to refill--1 year 

## 2016-09-27 NOTE — Telephone Encounter (Signed)
CY Please advise on refill. Thanks.  

## 2016-09-27 NOTE — Telephone Encounter (Signed)
Rx called into [facility] 

## 2016-10-29 ENCOUNTER — Ambulatory Visit (INDEPENDENT_AMBULATORY_CARE_PROVIDER_SITE_OTHER): Payer: 59 | Admitting: Internal Medicine

## 2016-10-29 ENCOUNTER — Encounter: Payer: Self-pay | Admitting: Internal Medicine

## 2016-10-29 DIAGNOSIS — F5104 Psychophysiologic insomnia: Secondary | ICD-10-CM | POA: Diagnosis not present

## 2016-10-29 DIAGNOSIS — F411 Generalized anxiety disorder: Secondary | ICD-10-CM

## 2016-10-29 MED ORDER — AMITRIPTYLINE HCL 25 MG PO TABS: 25.0000 mg | ORAL_TABLET | Freq: Every day | ORAL | 11 refills | Status: DC

## 2016-10-29 NOTE — Patient Instructions (Addendum)
Script printed to continue amitriptyline for sleep.  Ok to call for temazepam refill if needed  Please call if we can help

## 2016-10-29 NOTE — Progress Notes (Signed)
HPI  F never smoker from [place] 24 years ago, married, followed for chronic insomnia   -----------------------------------------------------------------------------------------  04/13/2016-51 year old female never smoker from [place] 24 years ago followed for insomnia-situational stress We increased amitriptyline to 50 mg at bedtime, with temazepam FOLLOWS FOR: Pt states she ususally sleeps well but when stressed from normal things then she has hard time sleeping-avg sleeping time is about 6 hours. Even when taking meds she cant sleep when stressed out. She continues to describe her sleep situation the same way. If she is under pressure at work, trying to make tight schedule this etc., then she sleeps poorly that night and is tired the next day. She does not recognize sedative carryover from her medications which she now takes every night. They definitely help. I'm recommending she see a counselor to help her manage job stress.  158/3418- 51 year old female never smoker from [place] 24 years ago followed for insomnia-situational stress FOLLOW UP FOR Patient states that she is here for a medicine refill.  She weaned herself off of temazepam but was unable to tolerate being off of amitriptyline which help sleep quality, mood and bladder spasm. She seems to be less stressed and more content with her life now.   ROS-see HPI Constitutional:   No-   weight loss, night sweats, fevers, chills,+fatigue, lassitude. HEENT:   No-  headaches, difficulty swallowing, tooth/dental problems, sore throat,       No-  sneezing, itching, ear ache, nasal congestion, post nasal drip,  CV:  No-   chest pain, orthopnea, PND, swelling in lower extremities, anasarca,  dizziness, palpitations Resp: No-   shortness of breath with exertion or at rest.              No-   productive cough,  No non-productive cough,  No- coughing up of blood.              No-   change in color of mucus.  No- wheezing.   Skin: No-   rash or  lesions. GI:  No-   heartburn, indigestion, abdominal pain, nausea, vomiting,  GU:  MS:  No-   joint pain or swelling.   Neuro-     nothing unusual Psych:  No- change in mood or affect. No depression + anxiety.  No memory loss.  OBJ- Physical Exam General- Alert, Oriented, Affect-appropriate, Distress- none acute, looks well-less anxious and more relaxed Skin- rash-none, lesions- none, excoriation- none Lymphadenopathy- none Head- atraumatic            Eyes- Gross vision intact, PERRLA, conjunctivae and secretions clear            Ears- Hearing, canals-normal            Nose- Clear, no-Septal dev, mucus, polyps, erosion, perforation             Throat- Mallampati II-III , mucosa clear , drainage- none, tonsils- atrophic Neck- flexible , trachea midline, no stridor , thyroid nl, carotid no bruit Chest - symmetrical excursion , unlabored           Heart/CV- RRR , no murmur , no gallop  , no rub, nl s1 s2                           - JVD- none , edema- none, stasis changes- none, varices- none           Lung- clear to P&A, wheeze- none, cough- none , dullness-none, rub-  none           Chest wall-  Abd-  Br/ Gen/ Rectal- Not done, not indicated Extrem- cyanosis- none, clubbing, none, atrophy- none, strength- nl Neuro- grossly intact to observation

## 2016-10-30 DIAGNOSIS — F411 Generalized anxiety disorder: Secondary | ICD-10-CM | POA: Insufficient documentation

## 2016-10-30 NOTE — Assessment & Plan Note (Signed)
We refilled amitriptyline with discussion. As long as she is better off with it than without it, there is no reason for her to stop. We also refill temazepam to leave it on her list so it will be available if needed. This reassured her although she hasn't been using it.

## 2016-10-30 NOTE — Assessment & Plan Note (Signed)
Chronic stress involving her job, marriage, perimenopausal status. She seems to be doing better at this visit.

## 2017-01-16 ENCOUNTER — Encounter: Payer: Self-pay | Admitting: Nurse Practitioner

## 2017-02-25 ENCOUNTER — Other Ambulatory Visit: Payer: Self-pay | Admitting: Internal Medicine

## 2017-02-26 NOTE — Telephone Encounter (Signed)
Ok refill x 6 months 

## 2017-02-26 NOTE — Telephone Encounter (Signed)
Please advise on refill.

## 2017-02-27 ENCOUNTER — Telehealth: Payer: Self-pay | Admitting: Internal Medicine

## 2017-02-27 MED ORDER — TEMAZEPAM 7.5 MG PO CAPS: ORAL_CAPSULE | ORAL | 5 refills | Status: DC

## 2017-02-27 NOTE — Telephone Encounter (Signed)
Rx has already been called in. Pt is aware. Nothing further was needed.

## 2017-07-14 IMAGING — CR DG CHEST 2V
2 series · 2 of 2 positions shown · non-contrast
Comparison: 06/12/2014

CLINICAL DATA: Chest, neck and right shoulder discomfort for 3
days.

EXAM:
CHEST  2 VIEW

[w chest pa]
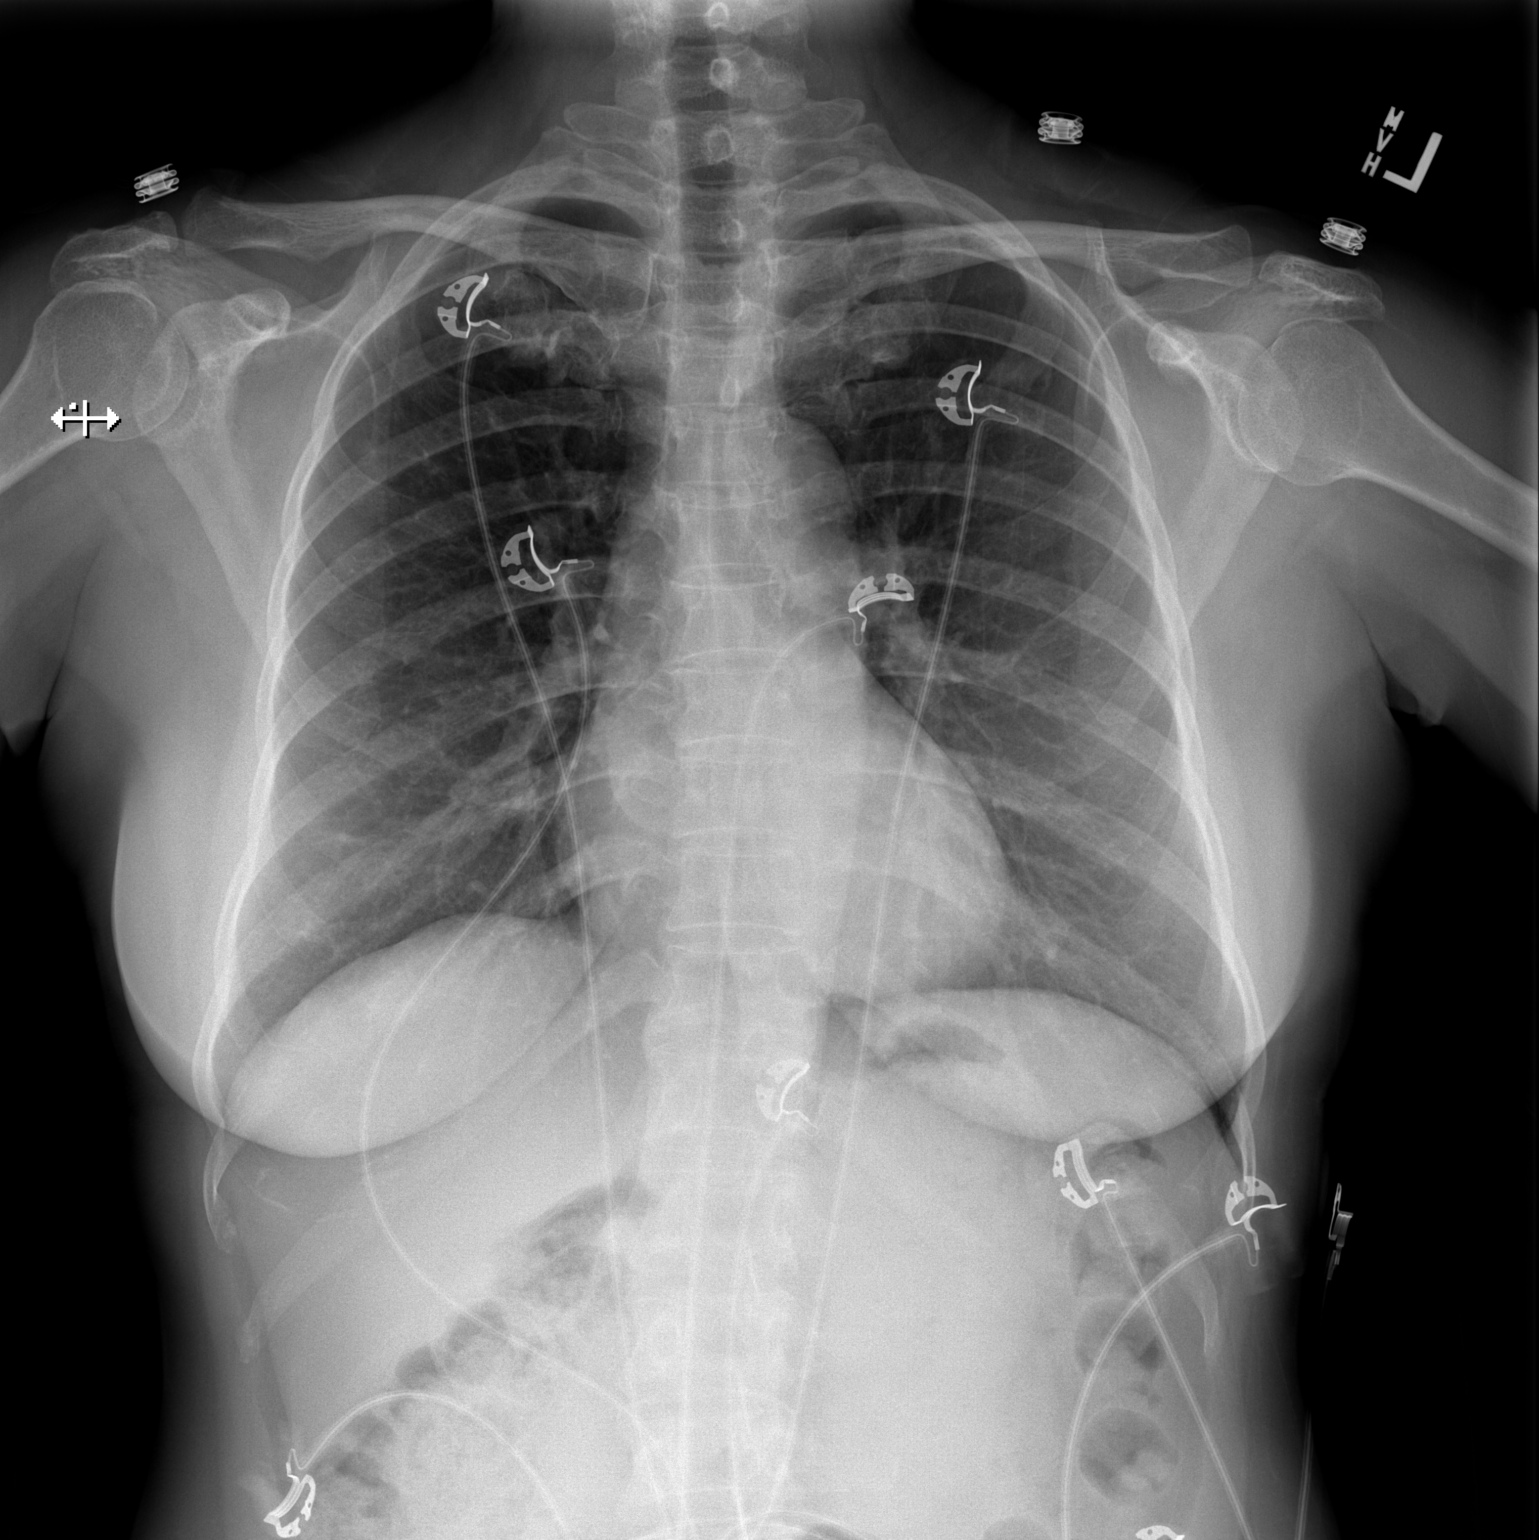

[w chest lat]
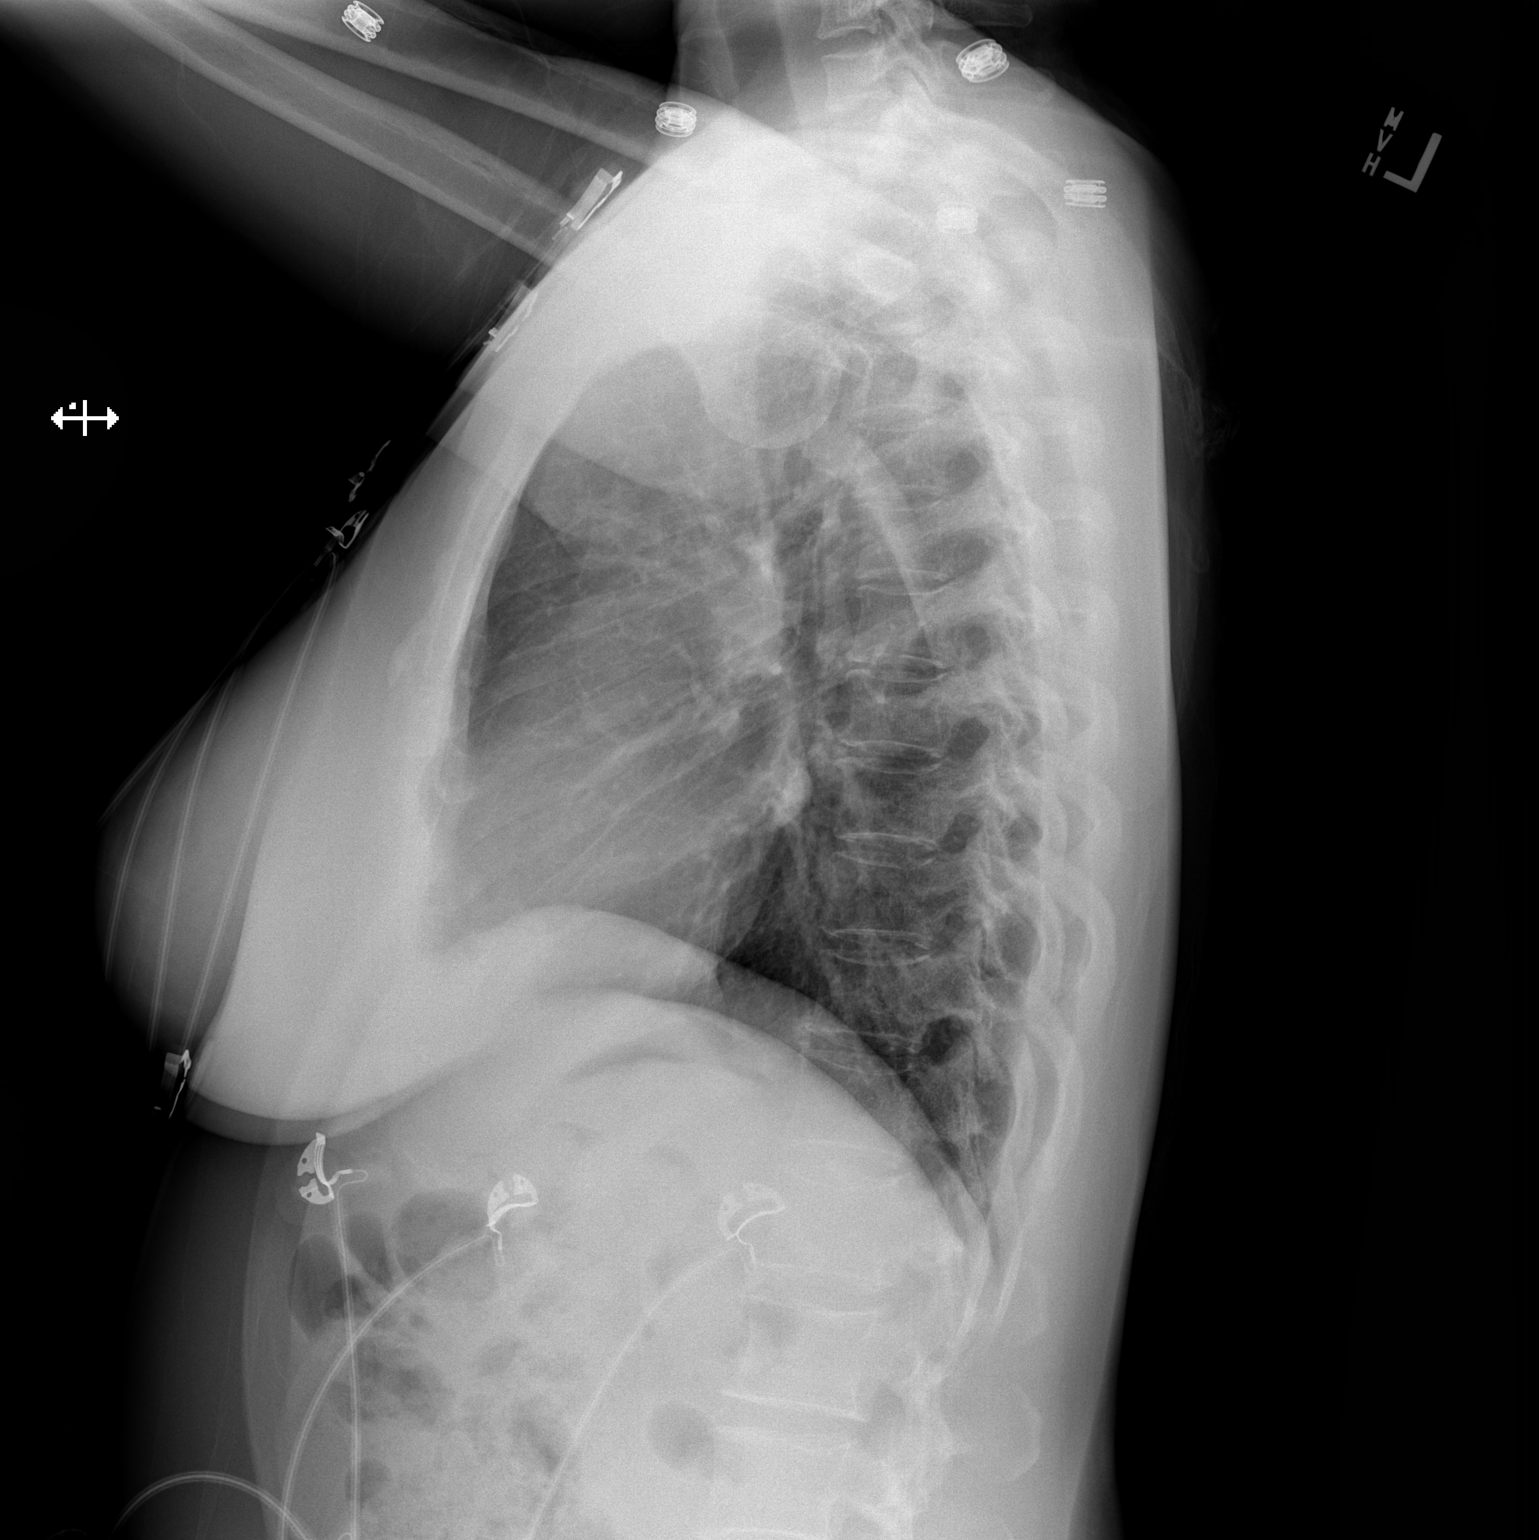

[2 of 2 positions shown; findings below may reference images not displayed]

FINDINGS: The cardiac silhouette, mediastinal and hilar contours are within
normal limits and stable. The lungs are clear. No pleural effusion.
The bony thorax is intact.
IMPRESSION: No acute cardiopulmonary findings.

## 2017-08-29 ENCOUNTER — Other Ambulatory Visit: Payer: Self-pay | Admitting: Internal Medicine

## 2017-08-29 NOTE — Telephone Encounter (Signed)
Received refill request for restoril 7.5mg  1-3 tablets as needed at bedtime.  Last filled on 02/27/17 90 capsules 0 refills. Last OV 5.7.18 by Dr. Maple HudsonYoung. Next OV is 11/04/17 with CY.   Dr. Maple HudsonYoung please advise on refill.  No Known Allergies   Current Outpatient Medications on File Prior to Visit  Medication Sig Dispense Refill  . amitriptyline (ELAVIL) 25 MG tablet Take 1 tablet (25 mg total) by mouth at bedtime. 30 tablet 11  . cholecalciferol (VITAMIN D) 1000 units tablet Take 1,000 Units by mouth daily.    . cyclobenzaprine (FLEXERIL) 5 MG tablet Take 1 tablet (5 mg total) by mouth 3 (three) times daily as needed for muscle spasms. (Patient not taking: Reported on 10/29/2016) 21 tablet 0  . temazepam (RESTORIL) 7.5 MG capsule take 1 to 3 tablet by mouth if needed for sleep 90 capsule 5   No current facility-administered medications on file prior to visit.

## 2017-08-29 NOTE — Telephone Encounter (Signed)
Ok to refill total 6 months 

## 2017-11-01 ENCOUNTER — Telehealth: Payer: Self-pay | Admitting: Obstetrics and Gynecology

## 2017-11-04 ENCOUNTER — Encounter: Payer: Self-pay | Admitting: Internal Medicine

## 2017-11-04 ENCOUNTER — Ambulatory Visit: Payer: 59 | Admitting: Internal Medicine

## 2017-11-04 DIAGNOSIS — F5104 Psychophysiologic insomnia: Secondary | ICD-10-CM | POA: Diagnosis not present

## 2017-11-04 MED ORDER — AMITRIPTYLINE HCL 25 MG PO TABS
25.0000 mg | ORAL_TABLET | Freq: Every day | ORAL | 11 refills | Status: DC
Start: 2017-11-04 — End: 2018-11-10

## 2017-11-04 MED ORDER — TRAZODONE HCL 50 MG PO TABS: ORAL_TABLET | ORAL | 5 refills | Status: DC

## 2017-11-04 NOTE — Progress Notes (Signed)
HPI  F never smoker from [place] 24 years ago, married, followed for chronic insomnia   -----------------------------------------------------------------------------------------  26/85- 52 year old female never smoker from [place] 24 years ago followed for insomnia-situational stress FOLLOW UP FOR Patient states that she is here for a medicine refill.  She weaned herself off of temazepam but was unable to tolerate being off of amitriptyline which help sleep quality, mood and bladder spasm. She seems to be less stressed and more content with her life now.  11/04/2017- 52 year old female never smoker from [place] 24 years ago followed for insomnia-situational stress ----Insomnia; Pt continues to have issues falling and staying asleep.  Does best with temazepam 2x 7.5 mg. Amitriptyline Continues amitriptyline 25 mg at bedtime.  If she took 3 of the 7.5 mg temazepam caps she was bothered by numbness at the back of her neck.  She has been taking a couple of herbal products.  Both are mixtures.  One contains melatonin.  She is menopausal now and we discussed impact on sleep patterns.  ROS-see HPI + = positive Constitutional:   No-   weight loss, night sweats, fevers, chills,+fatigue, lassitude. HEENT:   No-  headaches, difficulty swallowing, tooth/dental problems, sore throat,       No-  sneezing, itching, ear ache, nasal congestion, post nasal drip,  CV:  No-   chest pain, orthopnea, PND, swelling in lower extremities, anasarca,  dizziness, palpitations Resp: No-   shortness of breath with exertion or at rest.              No-   productive cough,  No non-productive cough,  No- coughing up of blood.              No-   change in color of mucus.  No- wheezing.   Skin: No-   rash or lesions. GI:  No-   heartburn, indigestion, abdominal pain, nausea, vomiting,  GU:  MS:  No-   joint pain or swelling.   Neuro-     nothing unusual Psych:  No- change in mood or affect. No depression + anxiety.  No  memory loss.  OBJ- Physical Exam General- Alert, Oriented, Affect-appropriate, Distress- none acute, looks well-less anxious and more relaxed Skin- rash-none, lesions- none, excoriation- none Lymphadenopathy- none Head- atraumatic            Eyes- Gross vision intact, PERRLA, conjunctivae and secretions clear            Ears- Hearing, canals-normal            Nose- Clear, no-Septal dev, mucus, polyps, erosion, perforation             Throat- Mallampati II-III , mucosa clear , drainage- none, tonsils- atrophic Neck- flexible , trachea midline, no stridor , thyroid nl, carotid no bruit Chest - symmetrical excursion , unlabored           Heart/CV- RRR , no murmur , no gallop  , no rub, nl s1 s2                           - JVD- none , edema- none, stasis changes- none, varices- none           Lung- clear to P&A, wheeze- none, cough- none , dullness-none, rub- none           Chest wall-  Abd-  Br/ Gen/ Rectal- Not done, not indicated Extrem- cyanosis- none, clubbing, none, atrophy- none, strength- nl  Neuro- grossly intact to observation

## 2017-11-04 NOTE — Patient Instructions (Addendum)
Script printed for trazodone 50 mg.   Take 1-3 for sleep as needed. Try this instead of Temazepam.  If you like the Temazepam better, you can go back to it.   Script printed to refill amitriptyline

## 2017-11-04 NOTE — Assessment & Plan Note (Signed)
I think she is managing her life issues better now.  She seems more composed.  We discussed options with emphasis still on good sleep habits. Plan-try trazodone instead of temazepam as needed.

## 2017-11-06 ENCOUNTER — Encounter: Payer: Self-pay | Admitting: Obstetrics and Gynecology

## 2017-11-06 ENCOUNTER — Other Ambulatory Visit: Payer: Self-pay

## 2017-11-06 ENCOUNTER — Other Ambulatory Visit (HOSPITAL_COMMUNITY)
Admission: RE | Admit: 2017-11-06 | Discharge: 2017-11-06 | Disposition: A | Discharge status: Home / Self Care | Payer: 59 | Source: Ambulatory Visit | Attending: Obstetrics and Gynecology | Admitting: Obstetrics and Gynecology

## 2017-11-06 ENCOUNTER — Ambulatory Visit: Payer: 59 | Admitting: Obstetrics and Gynecology

## 2017-11-06 VITALS — BP 108/72 | HR 80 | Resp 16 | Ht 61.25 in | Wt 126.0 lb

## 2017-11-06 DIAGNOSIS — F419 Anxiety disorder, unspecified: Secondary | ICD-10-CM | POA: Diagnosis not present

## 2017-11-06 DIAGNOSIS — N951 Menopausal and female climacteric states: Secondary | ICD-10-CM

## 2017-11-06 DIAGNOSIS — Z1211 Encounter for screening for malignant neoplasm of colon: Secondary | ICD-10-CM

## 2017-11-06 DIAGNOSIS — Z01419 Encounter for gynecological examination (general) (routine) without abnormal findings: Secondary | ICD-10-CM | POA: Insufficient documentation

## 2017-11-06 NOTE — Patient Instructions (Signed)

## 2017-11-06 NOTE — Progress Notes (Signed)
Patient scheduled while in office for screening MMG at [facility] on 11/25/17 at 9:15am. Patient verbalizes understanding and is agreeable.

## 2017-11-06 NOTE — Progress Notes (Signed)
52 y.o. [id number] Married [place] female here for annual exam.    No menses for over one year.  Wants to know what is happening with her hormones.  Not sleeping well for a long time, worse now.  Hot flashes and night sweats.  Decreased interest in sex.  Declines tx.  Wants to know if it is ok not to take medication for menopause.   PCP: Jetty Duhamel, MD   Patient's last menstrual period was 08/23/2016 (within months).           Sexually active: Yes.    The current method of family planning is none.    Exercising: Yes.    walking Smoker:  no  Health Maintenance: Pap:  08/07/13 Neg:Neg HR HPV History of abnormal Pap:  no MMG:  04/19/14 BIRADS 1 negative/density b Colonoscopy:  never BMD:   n/a  Result  n/a TDaP:  unsure Gardasil:   n/a HIV: 08/15/15 Negative Hep C: unsure Screening Labs:  Discuss today   reports that she has never smoked. She has never used smokeless tobacco. She reports that she does not drink alcohol or use drugs.  Past Medical History:  Diagnosis Date  . Anxiety     Past Surgical History:  Procedure Laterality Date  . BREAST ENHANCEMENT SURGERY Bilateral 10 years ago   Breast Lift    Current Outpatient Medications  Medication Sig Dispense Refill  . amitriptyline (ELAVIL) 25 MG tablet Take 1 tablet (25 mg total) by mouth at bedtime. 30 tablet 11  . cholecalciferol (VITAMIN D) 1000 units tablet Take 1,000 Units by mouth daily.    . Multiple Vitamins-Minerals (HAIR SKIN AND NAILS FORMULA PO) Take by mouth daily.    . traZODone (DESYREL) 50 MG tablet 1-3 for sleep as needed 60 tablet 5   No current facility-administered medications for this visit.     Family History  Problem Relation Age of Onset  . Anemia Father     Review of Systems  Constitutional: Negative.   HENT: Negative.   Eyes: Negative.   Respiratory: Negative.   Cardiovascular: Negative.   Gastrointestinal: Negative.   Endocrine: Negative.   Genitourinary: Negative.    Musculoskeletal: Negative.   Skin: Negative.   Allergic/Immunologic: Negative.   Neurological: Negative.   Hematological: Negative.   Psychiatric/Behavioral: Negative.     Exam:   BP 108/72 (BP Location: Right Arm, Patient Position: Sitting, Cuff Size: Normal)   Pulse 80   Resp 16   Ht 5' 1.25" (1.556 m)   Wt 126 lb (57.2 kg)   LMP 08/23/2016 (Within Months)   BMI 23.61 kg/m     General appearance: alert, cooperative and appears stated age Head: Normocephalic, without obvious abnormality, atraumatic Neck: no adenopathy, supple, symmetrical, trachea midline and thyroid normal to inspection and palpation Lungs: clear to auscultation bilaterally Breasts: normal appearance, no masses or tenderness, No nipple retraction or dimpling, No nipple discharge or bleeding, No axillary or supraclavicular adenopathy.  Mild erythematous stripe between the breasts. Heart: regular rate and rhythm Abdomen: soft, non-tender; no masses, no organomegaly Extremities: extremities normal, atraumatic, no cyanosis or edema Skin: Skin color, texture, turgor normal. No rashes or lesions Lymph nodes: Cervical, supraclavicular, and axillary nodes normal. No abnormal inguinal nodes palpated Neurologic: Grossly normal  Pelvic: External genitalia:  no lesions              Urethra:  normal appearing urethra with no masses, tenderness or lesions  Bartholins and Skenes: normal                 Vagina: normal appearing vagina with normal color and discharge, no lesions              Cervix: no lesions              Pap taken: Yes.   Bimanual Exam:  Uterus:  normal size, contour, position, consistency, mobility, non-tender              Adnexa: no mass, fullness, tenderness              Rectal exam: Yes.  .  Confirms.              Anus:  normal sphincter tone, no lesions  Chaperone was present for exam.  Assessment:   Well woman visit with normal exam. Menopausal symptoms.  Sleep disturbance.  On  Elavil and Trazodone.   Plan: Mammogram screening.  We will schedule for her.  Recommended self breast awareness. Pap and HR HPV as above. Guidelines for Calcium, Vitamin D, regular exercise program including cardiovascular and weight bearing exercise. Check FSH and E2.  Routine labs.  Declines Rx for menopausal symptoms.   We talked about HRT risks and benefits.  Brochure on menopause. Referral for colonoscopy.  Follow up annually and prn.   After visit summary provided.

## 2017-11-07 LAB — FOLLICLE STIMULATING HORMONE: FSH: 67.8 m[IU]/mL

## 2017-11-07 LAB — LIPID PANEL
Chol/HDL Ratio: 4.9 ratio — ABNORMAL HIGH (ref 0.0–4.4)
Cholesterol, Total: 253 mg/dL — ABNORMAL HIGH (ref 100–199)
HDL: 52 mg/dL (ref >39)
LDL CALC: 170 mg/dL — AB (ref 0–99)
Triglycerides: 157 mg/dL — ABNORMAL HIGH (ref 0–149)
VLDL CHOLESTEROL CAL: 31 mg/dL (ref 5–40)

## 2017-11-07 LAB — COMPREHENSIVE METABOLIC PANEL
ALK PHOS: 72 IU/L (ref 39–117)
ALT: 8 IU/L (ref 0–32)
AST: 20 IU/L (ref 0–40)
Albumin/Globulin Ratio: 1.5 (ref 1.2–2.2)
Albumin: 4.7 g/dL (ref 3.5–5.5)
BUN/Creatinine Ratio: 18 (ref 9–23)
BUN: 12 mg/dL (ref 6–24)
Bilirubin Total: <0.2 mg/dL (ref 0.0–1.2)
CO2: 27 mmol/L (ref 20–29)
CREATININE: 0.67 mg/dL (ref 0.57–1.00)
Calcium: 9.4 mg/dL (ref 8.7–10.2)
Chloride: 102 mmol/L (ref 96–106)
GFR calc Af Amer: 118 mL/min/{1.73_m2} (ref >59)
GFR calc non Af Amer: 102 mL/min/{1.73_m2} (ref >59)
GLOBULIN, TOTAL: 3.2 g/dL (ref 1.5–4.5)
Glucose: 82 mg/dL (ref 65–99)
POTASSIUM: 4.1 mmol/L (ref 3.5–5.2)
SODIUM: 143 mmol/L (ref 134–144)
Total Protein: 7.9 g/dL (ref 6.0–8.5)

## 2017-11-07 LAB — CBC
HEMATOCRIT: 37.9 % (ref 34.0–46.6)
Hemoglobin: 12.8 g/dL (ref 11.1–15.9)
MCH: 31.1 pg (ref 26.6–33.0)
MCHC: 33.8 g/dL (ref 31.5–35.7)
MCV: 92 fL (ref 79–97)
PLATELETS: 323 10*3/uL (ref 150–379)
RBC: 4.12 x10E6/uL (ref 3.77–5.28)
RDW: 13.5 % (ref 12.3–15.4)
WBC: 7.3 10*3/uL (ref 3.4–10.8)

## 2017-11-07 LAB — TSH: TSH: 0.868 u[IU]/mL (ref 0.450–4.500)

## 2017-11-07 LAB — ESTRADIOL: Estradiol: 15.9 pg/mL

## 2017-11-07 LAB — VITAMIN D 25 HYDROXY (VIT D DEFICIENCY, FRACTURES): VIT D 25 HYDROXY: 24.1 ng/mL — AB (ref 30.0–100.0)

## 2017-11-08 LAB — CYTOLOGY - PAP
Diagnosis: NEGATIVE
HPV (WINDOPATH): NOT DETECTED

## 2017-11-11 ENCOUNTER — Telehealth: Payer: Self-pay

## 2017-11-11 ENCOUNTER — Encounter: Payer: Self-pay | Admitting: Obstetrics and Gynecology

## 2017-11-11 NOTE — Telephone Encounter (Signed)
Spoke with patient. Advised of results as seen below from Dr.Silva. Patient verbalizes understanding. Will follow up with PCP. 02 recall placed. Encounter closed.  Notes recorded by Patton Salles, MD on 11/11/2017 at 8:50 AM EDT This is a second note for this patient. (That is why I highlighted it.) Her thyroid testing is also normal. ------  Notes recorded by Patton Salles, MD on 11/11/2017 at 8:49 AM EDT Please contact patient with results.  Her cholesterol is elevated. It took a big jump up since her prior testing 2 years ago.  She will need to follow up with her PCP.  In the mean time she can start a diet low in cholesterol and start a vigorous exercise program.   Her vitamin D is low, and I recommend she increase her vitamin D supplementation to 2000 IU daily.  Her CBC and CMP are normal.   Her hormonal testing indicates menopause.  Please have her call for any future vaginal bleeding.   Her pap is normal and her HR HPV status is negative.  Pap recall - 02.

## 2017-11-12 ENCOUNTER — Telehealth: Payer: Self-pay | Admitting: Internal Medicine

## 2017-11-12 MED ORDER — TEMAZEPAM 7.5 MG PO CAPS: 7.5000 mg | ORAL_CAPSULE | Freq: Every evening | ORAL | 5 refills | Status: DC | PRN

## 2017-11-12 NOTE — Telephone Encounter (Signed)
Spoke with pt. At her last OV, Dr. Maple Hudson changed her sleep medication. Pt does not feel like Trazodone is helping her and wants to go back on Temazepam. Per her AVS insructions, CY stated that she could back on the Temazepam if she did not like Trazodone. Rx has been called in. Nothing further was needed.

## 2017-12-06 ENCOUNTER — Telehealth: Payer: Self-pay | Admitting: Obstetrics and Gynecology

## 2017-12-06 NOTE — Telephone Encounter (Signed)
Call placed in reference to a referral for [facility].

## 2017-12-09 ENCOUNTER — Encounter: Payer: Self-pay | Admitting: Obstetrics and Gynecology

## 2018-04-07 ENCOUNTER — Encounter: Payer: Self-pay | Admitting: Physical Therapy

## 2018-04-07 ENCOUNTER — Ambulatory Visit: Payer: 59 | Attending: Nurse Practitioner | Admitting: Physical Therapy

## 2018-04-07 DIAGNOSIS — M546 Pain in thoracic spine: Secondary | ICD-10-CM | POA: Insufficient documentation

## 2018-04-07 DIAGNOSIS — M542 Cervicalgia: Secondary | ICD-10-CM | POA: Diagnosis not present

## 2018-04-07 DIAGNOSIS — R252 Cramp and spasm: Secondary | ICD-10-CM | POA: Diagnosis present

## 2018-04-07 NOTE — Therapy (Signed)
[facility] [address] [place], [place], [address] Phone: [phone]   Fax:  [phone]  Physical Therapy Evaluation  Patient Details  Name: Erika Scott MRN: [id number] Date of Birth: [birth year] Referring Provider (PT): Zoe Lan   Encounter Date: 04/07/2018  PT End of Session - 04/07/18 0913    Visit Number  1    Date for PT Re-Evaluation  06/07/18    PT Start Time  0855    PT Stop Time  0938    PT Time Calculation (min)  43 min    Activity Tolerance  Patient tolerated treatment well    Behavior During Therapy  [facility] for tasks assessed/performed       Past Medical History:  Diagnosis Date  . Anxiety   . Hyperlipidemia   . Low vitamin D level     Past Surgical History:  Procedure Laterality Date  . BREAST ENHANCEMENT SURGERY Bilateral 10 years ago   Breast Lift    There were no vitals filed for this visit.   Subjective Assessment - 04/07/18 0856    Subjective  Patient reports that she has had some back pain for a number of years, reports that recently her pain has been worse with pain into the head and the right arm.  X-rays negative    Limitations  Lifting;House hold activities    Patient Stated Goals  have less pain    Currently in Pain?  Yes    Pain Score  5     Pain Location  Neck    Pain Orientation  Right    Pain Descriptors / Indicators  Aching;Tightness    Pain Type  Chronic pain    Pain Radiating Towards  some pain into the right head area and the right arm, denies numbness    Pain Onset  More than a month ago    Pain Frequency  Constant    Aggravating Factors   lifting, reaching pain up to 8/10    Pain Relieving Factors  "pain always there, about a 5/10"    Effect of Pain on Daily Activities  just hurts         [facility] PT Assessment - 04/07/18 0001      Assessment   Medical Diagnosis  neck and thoracic pain    Referring Provider (PT)  Zoe Lan    Onset Date/Surgical Date   03/08/18    Hand Dominance  Right    Prior Therapy  no      Precautions   Precautions  None      Balance Screen   Has the patient fallen in the past 6 months  No    Has the patient had a decrease in activity level because of a fear of falling?   No    Is the patient reluctant to leave their home because of a fear of falling?   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    Leisure  no exercise      Posture/Postural Control   Posture Comments  fwd head, rounded shoulders      ROM / Strength   AROM / PROM / Strength  AROM;Strength  AROM   Overall AROM Comments  Cervical ROM decreased 25% with pain in the right cervical and head area and some into the right shoulder, right shoulder AROM WNL's except for IR was to 55 degrees      Strength   Overall Strength Comments  shoulder strength was 4-/5 with some right scapular pain      Palpation   Palpation comment  she is very tender in the right rhomboid and levator area, some tenderness and tightness in the cervical and upper trap area., there is a knot in the right levator area that seems to cause some of her Zoelle pain                Objective measurements completed on examination: See above findings.      [facility] Adult PT Treatment/Exercise - 04/07/18 0001      Modalities   Modalities  Electrical Stimulation;Moist Heat      Moist Heat Therapy   Number Minutes Moist Heat  15 Minutes    Moist Heat Location  Cervical      Electrical Stimulation   Electrical Stimulation Location  right cervical and thoracic area    Electrical Stimulation Action  IFC    Electrical Stimulation Parameters  supine    Electrical Stimulation Goals  Pain             PT Education - 04/07/18 0913    Education Details   cervical and thoracic retraction, shrugs and levator and upper trap stretches    Person(s) Educated  Patient    Methods  Explanation;Demonstration;Handout    Comprehension  Verbalized understanding;Returned demonstration       PT Short Term Goals - 04/07/18 0916      PT SHORT TERM GOAL #1   Title  independent with initial HEP    Time  2    Period  Weeks    Status  New        PT Long Term Goals - 04/07/18 0916      PT LONG TERM GOAL #1   Title  understand proper posture and body mechanics    Time  8    Period  Weeks    Status  New      PT LONG TERM GOAL #2   Title  increase cervical ROM 25%    Time  8    Period  Weeks    Status  New      PT LONG TERM GOAL #3   Title  decrease pain 50%    Time  8    Period  Weeks    Status  New      PT LONG TERM GOAL #4   Title  be able to reach bra with right hand without difficulty    Time  8    Period  Weeks    Status  New             Plan - 04/07/18 0913    Clinical Impression Statement  Patient reports right side neck and thoracic area pain with Javana and sometimes right UE pain, she denies numbness, x-rays negative, 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  She has a lot of tightness and an area in the irght levator area that replicates the Mariadelaluz pain.  She has some limitation in cervical ROM and right shoulder IR    Clinical Presentation  Stable    Clinical Decision Making  Low  Rehab Potential  Good    PT Frequency  2x / week    PT Duration  8 weeks    PT Treatment/Interventions  ADLs/Self Care Home Management;Cryotherapy;Electrical Stimulation;Traction;Moist Heat;Ultrasound;Therapeutic activities;Therapeutic exercise;Manual techniques;Patient/family education;Dry needling    PT Next Visit Plan  try some stabilization exercises, could try STM    Consulted and Agree with Plan of Care  Patient       Patient will benefit from skilled therapeutic intervention in order to improve the  following deficits and impairments:  Decreased range of motion, Increased muscle spasms, Impaired UE functional use, Pain, Improper body mechanics, Impaired flexibility, Decreased strength, Postural dysfunction  Visit Diagnosis: Cervicalgia - Plan: PT plan of care cert/re-cert  Pain in thoracic spine - Plan: PT plan of care cert/re-cert  Cramp and spasm - Plan: PT plan of care cert/re-cert     Problem List Patient Active Problem List   Diagnosis Date Noted  . Anxiety state 10/30/2016  . Chronic insomnia 05/31/2013    Jearld Lesch., PT 04/07/2018, 9:19 AM  [facility] [address] [place], [place], [address] Phone: [phone]   Fax:  [phone]  Name: Erika Scott MRN: [id number] Date of Birth: 04/19/1966

## 2018-04-14 ENCOUNTER — Encounter: Payer: Self-pay | Admitting: Physical Therapy

## 2018-04-14 ENCOUNTER — Ambulatory Visit: Payer: 59 | Admitting: Physical Therapy

## 2018-04-14 DIAGNOSIS — M542 Cervicalgia: Secondary | ICD-10-CM

## 2018-04-14 DIAGNOSIS — R252 Cramp and spasm: Secondary | ICD-10-CM

## 2018-04-14 DIAGNOSIS — M546 Pain in thoracic spine: Secondary | ICD-10-CM

## 2018-04-14 NOTE — Therapy (Signed)
[facility] [address] [place], [place], [address] Phone: [phone]   Fax:  [phone]  Physical Therapy Treatment  Patient Details  Name: Erika Scott MRN: [id number] Date of Birth: 1966/05/22 Referring Provider (PT): Zoe Lan   Encounter Date: 04/14/2018  PT End of Session - 04/14/18 1007    Visit Number  2    Date for PT Re-Evaluation  06/07/18    PT Start Time  0929    PT Stop Time  1023    PT Time Calculation (min)  54 min    Activity Tolerance  Patient tolerated treatment well    Behavior During Therapy  [facility] for tasks assessed/performed       Past Medical History:  Diagnosis Date  . Anxiety   . Hyperlipidemia   . Low vitamin D level     Past Surgical History:  Procedure Laterality Date  . BREAST ENHANCEMENT SURGERY Bilateral 10 years ago   Breast Lift    There were no vitals filed for this visit.  Subjective Assessment - 04/14/18 0929    Subjective  Pt stated that's he sill has some pain, E stim did help    Currently in Pain?  Yes    Pain Score  4     Pain Location  Scapula    Pain Orientation  Right                       [facility] Adult PT Treatment/Exercise - 04/14/18 0001      Exercises   Exercises  Lumbar;Shoulder      Lumbar Exercises: Aerobic   Nustep  L3 x 6 min       Lumbar Exercises: Standing   Row  Theraband;20 reps;Both;Strengthening    Theraband Level (Row)  Level 2 (Red)    Shoulder Extension  Theraband;20 reps;Both;Strengthening    Theraband Level (Shoulder Extension)  Level 1 (Yellow)    Other Standing Lumbar Exercises  external Rotation red 2x10       Lumbar Exercises: Seated   Sit to Stand  20 reps   OHP with red ball      Shoulder Exercises: Standing   Flexion  20 reps;Both;Weights    Shoulder Flexion Weight (lbs)  1    ABduction  Weights;20 reps;Both;Strengthening    Shoulder ABduction Weight (lbs)  2      Modalities   Modalities  Electrical  Stimulation;Moist Heat      Moist Heat Therapy   Number Minutes Moist Heat  15 Minutes    Moist Heat Location  Cervical      Electrical Stimulation   Electrical Stimulation Location  right cervical and thoracic area    Electrical Stimulation Action  IFC    Electrical Stimulation Parameters  supine    Electrical Stimulation Goals  Pain      Manual Therapy   Manual Therapy  Soft tissue mobilization;Passive ROM;Manual Traction    Manual therapy comments  Full PROM    Soft tissue mobilization  posterior cervical para spinales    Passive ROM  cervical spine all directions    Manual Traction  4X10''               PT Short Term Goals - 04/14/18 1015      PT SHORT TERM GOAL #1   Title  independent with initial HEP    Status  Achieved        PT Long Term  Goals - 04/07/18 0916      PT LONG TERM GOAL #1   Title  understand proper posture and body mechanics    Time  8    Period  Weeks    Status  New      PT LONG TERM GOAL #2   Title  increase cervical ROM 25%    Time  8    Period  Weeks    Status  New      PT LONG TERM GOAL #3   Title  decrease pain 50%    Time  8    Period  Weeks    Status  New      PT LONG TERM GOAL #4   Title  be able to reach bra with right hand without difficulty    Time  8    Period  Weeks    Status  New            Plan - 04/14/18 1008    Clinical Impression Statement  Pt tolerated an initial progression to TE well. Cues for pacing and to slow down when doing reps. UE fatigue reported with extension and external rotation. Tightness in upper traps noted on both sides during MT. PT reports that's he thinks she is working too much and her body is reacting.    Rehab Potential  Good    PT Frequency  2x / week    PT Duration  8 weeks    PT Treatment/Interventions  ADLs/Self Care Home Management;Cryotherapy;Electrical Stimulation;Traction;Moist Heat;Ultrasound;Therapeutic activities;Therapeutic exercise;Manual techniques;Patient/family  education;Dry needling    PT Next Visit Plan  try some stabilization exercises, could try STM       Patient will benefit from skilled therapeutic intervention in order to improve the following deficits and impairments:  Decreased range of motion, Increased muscle spasms, Impaired UE functional use, Pain, Improper body mechanics, Impaired flexibility, Decreased strength, Postural dysfunction  Visit Diagnosis: Pain in thoracic spine  Cervicalgia  Cramp and spasm     Problem List Patient Active Problem List   Diagnosis Date Noted  . Anxiety state 10/30/2016  . Chronic insomnia 05/31/2013    Grayce Sessions, PTA 04/14/2018, 10:16 AM  [facility] [address] [place], [place], [address] Phone: [phone]   Fax:  [phone]  Name: Erika Scott MRN: [id number] Date of Birth: Feb 15, 1966

## 2018-04-17 ENCOUNTER — Ambulatory Visit: Payer: 59 | Admitting: Physical Therapy

## 2018-04-17 ENCOUNTER — Encounter: Payer: Self-pay | Admitting: Physical Therapy

## 2018-04-17 DIAGNOSIS — M542 Cervicalgia: Secondary | ICD-10-CM | POA: Diagnosis not present

## 2018-04-17 DIAGNOSIS — R252 Cramp and spasm: Secondary | ICD-10-CM

## 2018-04-17 DIAGNOSIS — M546 Pain in thoracic spine: Secondary | ICD-10-CM

## 2018-04-17 NOTE — Therapy (Addendum)
[place] [address] [address] [place], [place], [address] Phone: [phone]   Fax:  [phone]  Physical Therapy Treatment  Patient Details  Name: Erika Scott MRN: [id number] Date of Birth: Nov 25, 1965 Referring Provider (PT): Eldridge Abrahams   Encounter Date: 04/17/2018  PT End of Session - 04/17/18 0833    Date for PT Re-Evaluation  06/07/18    PT Start Time  0750    PT Stop Time  0838    PT Time Calculation (min)  48 min    Activity Tolerance  Patient tolerated treatment well    Behavior During Therapy  [facility] for tasks assessed/performed       Past Medical History:  Diagnosis Date  . Anxiety   . Hyperlipidemia   . Low vitamin D level     Past Surgical History:  Procedure Laterality Date  . BREAST ENHANCEMENT SURGERY Bilateral 10 years ago   Breast Lift    There were no vitals filed for this visit.  Subjective Assessment - 04/17/18 0759    Subjective  Patient reports that she is feeling a little better, still hurting    Currently in Pain?  Yes    Pain Score  4     Pain Location  Scapula    Pain Orientation  Right                       [facility] Adult PT Treatment/Exercise - 04/17/18 0001      Exercises   Exercises  Shoulder      Lumbar Exercises: Aerobic   Nustep  L3 x 6 min       Lumbar Exercises: Standing   Row  Theraband;20 reps;Both;Strengthening    Theraband Level (Row)  Level 2 (Red)    Shoulder Extension  Theraband;20 reps;Both;Strengthening    Theraband Level (Shoulder Extension)  Level 1 (Yellow)    Other Standing Lumbar Exercises  external Rotation red 2x10       Shoulder Exercises: Standing   Shoulder Elevation Limitations  4# shrugs with upper trap and levator stretches    Other Standing Exercises  W backs, overhead weighted ball lift      Moist Heat Therapy   Number Minutes Moist Heat  15 Minutes    Moist Heat Location  Cervical      Electrical Stimulation   Electrical  Stimulation Location  C/T area    Electrical Stimulation Action  IFC    Electrical Stimulation Parameters  supine    Electrical Stimulation Goals  Pain               PT Short Term Goals - 04/17/18 0837      PT SHORT TERM GOAL #1   Title  independent with initial HEP    Status  Achieved        PT Long Term Goals - 04/07/18 0916      PT LONG TERM GOAL #1   Title  understand proper posture and body mechanics    Time  8    Period  Weeks    Status  New      PT LONG TERM GOAL #2   Title  increase cervical ROM 25%    Time  8    Period  Weeks    Status  New      PT LONG TERM GOAL #3   Title  decrease pain 50%    Time  8  Period  Weeks    Status  New      PT LONG TERM GOAL #4   Title  be able to reach bra with right hand without difficulty    Time  8    Period  Weeks    Status  New            Plan - 04/17/18 6979    Clinical Impression Statement  Patient fatigues easily, needs some rest breaks.  She has difficulty maintaining the posture.      PT Next Visit Plan  try some stabilization exercises, could try STM    Consulted and Agree with Plan of Care  Patient       Patient will benefit from skilled therapeutic intervention in order to improve the following deficits and impairments:  Decreased range of motion, Increased muscle spasms, Impaired UE functional use, Pain, Improper body mechanics, Impaired flexibility, Decreased strength, Postural dysfunction  Visit Diagnosis: Pain in thoracic spine  Cervicalgia  Cramp and spasm     Problem List Patient Active Problem List   Diagnosis Date Noted  . Anxiety state 10/30/2016  . Chronic insomnia 05/31/2013   PHYSICAL THERAPY DISCHARGE SUMMARY   Plan: Patient agrees to discharge.  Patient goals were not met. Patient is being discharged due to not returning since the last visit.  ?????      Sumner Boast., PT 04/17/2018, 8:38 AM  [address] [place], [place], [address] Phone: [phone]   Fax:  [phone]  Name: Lindora Alviar MRN: [id number] Date of Birth: Nov 07, 1965

## 2018-04-24 ENCOUNTER — Ambulatory Visit: Payer: 59 | Admitting: Physical Therapy

## 2018-05-21 ENCOUNTER — Other Ambulatory Visit: Payer: Self-pay | Admitting: Pulmonary Disease

## 2018-05-26 NOTE — Telephone Encounter (Signed)
CY Please advise on refill. Thanks.  

## 2018-05-26 NOTE — Telephone Encounter (Signed)
Ok to refill total 6 months 

## 2018-06-23 ENCOUNTER — Other Ambulatory Visit: Payer: Self-pay | Admitting: Internal Medicine

## 2018-06-23 ENCOUNTER — Telehealth: Payer: Self-pay | Admitting: Internal Medicine

## 2018-06-23 NOTE — Telephone Encounter (Signed)
Patient husband is here and wanted to speak to to someone about a refill that was called in for Temazepam.

## 2018-06-23 NOTE — Telephone Encounter (Signed)
CY Please advise on refill; Last OV 10/2017 and due back 10/2018  Thanks.

## 2018-06-23 NOTE — Telephone Encounter (Signed)
Per CY- ok to refill.  Dr. Maple HudsonYoung e script refill to [facility] on [address].  Called and let Patient know that temazepam had been sent to pharmacy.  Understanding stated.  Nothing further at this time.

## 2018-06-23 NOTE — Telephone Encounter (Signed)
Ok to refill total 6 months- or do you want me to e-script?

## 2018-06-23 NOTE — Telephone Encounter (Signed)
Pt's spouse Elijah Birkom was in lobby in regards to pt's temazepam Rx.  Pt is needing a refill of med as she is going to run out of the med today, 06/23/18. Dr. Maple HudsonYoung, please advise if it is okay to refill med for pt. Med was last filled 11/12/17, #90 with 5 RF with the instructions take 1-3 capsules by mouth at bedtime prn sleep.   Pt last seen at office 11/04/17. Dr. Maple HudsonYoung, please advise if it is okay to refill med for pt and if it is okay, can you electronically send it to pt's pharmacy of [facility] off [address]? Thanks!  No Known Allergies   Current Outpatient Medications:  .  amitriptyline (ELAVIL) 25 MG tablet, Take 1 tablet (25 mg total) by mouth at bedtime., Disp: 30 tablet, Rfl: 11 .  cholecalciferol (VITAMIN D) 1000 units tablet, Take 1,000 Units by mouth daily., Disp: , Rfl:  .  Multiple Vitamins-Minerals (HAIR SKIN AND NAILS FORMULA PO), Take by mouth daily., Disp: , Rfl:  .  temazepam (RESTORIL) 7.5 MG capsule, Take 1-3 capsules (7.5-22.5 mg total) by mouth at bedtime as needed for sleep., Disp: 90 capsule, Rfl: 5 .  traZODone (DESYREL) 50 MG tablet, 1-3 for sleep as needed, Disp: 60 tablet, Rfl: 5

## 2018-06-26 ENCOUNTER — Telehealth: Payer: Self-pay | Admitting: Internal Medicine

## 2018-06-26 NOTE — Telephone Encounter (Signed)
LMTCB

## 2018-06-27 NOTE — Telephone Encounter (Signed)
Spoke with pt. She had a question about her Temazepam prescription. States that her pharmacy only gave her a prescription for 2 capsules daily instead of the 3 capsules daily we sent in. Advised her to check with her pharmacy about this, it might have something to do with her insurance. Pt agreed and verbalized understanding. She will call us back if she continues to have issues.

## 2018-06-27 NOTE — Telephone Encounter (Signed)
Pt is returning call. Cb is [phone]

## 2018-06-30 ENCOUNTER — Telehealth: Payer: Self-pay | Admitting: Internal Medicine

## 2018-06-30 NOTE — Telephone Encounter (Signed)
This will have to be addressed by CY in the morning as he is gone for the day.

## 2018-07-01 NOTE — Telephone Encounter (Signed)
This was just a FYI for CY. It seems CY doesn't have to do anything. This has been taken care of  And the medication seems to be working for her. Will close encounter.

## 2018-07-01 NOTE — Telephone Encounter (Signed)
The current med listing looks correct, saying # 90 caps were sent on 12/ 30. What do I need to do?

## 2018-07-21 ENCOUNTER — Other Ambulatory Visit: Payer: Self-pay | Admitting: Internal Medicine

## 2018-07-22 NOTE — Telephone Encounter (Signed)
CY please advise if this is ok to refill. Patient is requesting refill of Restoril. Thank you. Last refill was sent in on 06/30/2018   Current Outpatient Medications on File Prior to Visit  Medication Sig Dispense Refill  . amitriptyline (ELAVIL) 25 MG tablet Take 1 tablet (25 mg total) by mouth at bedtime. 30 tablet 11  . cholecalciferol (VITAMIN D) 1000 units tablet Take 1,000 Units by mouth daily.    . Multiple Vitamins-Minerals (HAIR SKIN AND NAILS FORMULA PO) Take by mouth daily.    . temazepam (RESTORIL) 7.5 MG capsule TAKE 1 TO 3 CAPSULE BY MOUTH ONCE DAILY 90 capsule 0  . traZODone (DESYREL) 50 MG tablet 1-3 for sleep as needed 60 tablet 5   No current facility-administered medications on file prior to visit.    No Known Allergies

## 2018-07-23 ENCOUNTER — Other Ambulatory Visit: Payer: Self-pay | Admitting: Internal Medicine

## 2018-07-23 MED ORDER — TEMAZEPAM 7.5 MG PO CAPS: ORAL_CAPSULE | ORAL | 5 refills | Status: DC

## 2018-09-10 ENCOUNTER — Encounter: Payer: Self-pay | Admitting: Gastroenterology

## 2018-11-10 ENCOUNTER — Other Ambulatory Visit: Payer: Self-pay

## 2018-11-10 ENCOUNTER — Ambulatory Visit: Payer: 59 | Admitting: Internal Medicine

## 2018-11-10 ENCOUNTER — Ambulatory Visit: Payer: Self-pay | Admitting: Internal Medicine

## 2018-11-10 ENCOUNTER — Encounter: Payer: Self-pay | Admitting: Internal Medicine

## 2018-11-10 DIAGNOSIS — F411 Generalized anxiety disorder: Secondary | ICD-10-CM

## 2018-11-10 DIAGNOSIS — F5104 Psychophysiologic insomnia: Secondary | ICD-10-CM | POA: Diagnosis not present

## 2018-11-10 MED ORDER — TEMAZEPAM 7.5 MG PO CAPS: ORAL_CAPSULE | ORAL | 5 refills | Status: DC

## 2018-11-10 MED ORDER — AMITRIPTYLINE HCL 10 MG PO TABS: ORAL_TABLET | ORAL | 5 refills | Status: DC

## 2018-11-10 NOTE — Progress Notes (Signed)
HPI  F never smoker from [place] 24 years ago, married, followed for chronic insomnia   -----------------------------------------------------------------------------------------  11/04/2017- 53 year old female never smoker from [place] 24 years ago followed for insomnia-situational stress ----Insomnia; Pt continues to have issues falling and staying asleep.  Does best with temazepam 2x 7.5 mg. Amitriptyline Continues amitriptyline 25 mg at bedtime.  If she took 3 of the 7.5 mg temazepam caps she was bothered by numbness at the back of her neck.  She has been taking a couple of herbal products.  Both are mixtures.  One contains melatonin.  She is menopausal now and we discussed impact on sleep patterns.  11/10/2018- 53 year old female never smoker from [place] 30 years ago followed for insomnia-situational stress  temazepam 7.5, amitriptyline 25 -----no new or worsening symptoms, states she exercises, which helps her sleep Working less, which means less stress and better sleep for her. Continues to use temazepam and amitriptyline as needed with no problems. We discussed lowering dose of amitriptyline a little.  ROS-see HPI + = positive Constitutional:   No-   weight loss, night sweats, fevers, chills,+fatigue, lassitude. HEENT:   No-  headaches, difficulty swallowing, tooth/dental problems, sore throat,       No-  sneezing, itching, ear ache, nasal congestion, post nasal drip,  CV:  No-   chest pain, orthopnea, PND, swelling in lower extremities, anasarca,  dizziness, palpitations Resp: No-   shortness of breath with exertion or at rest.              No-   productive cough,  No non-productive cough,  No- coughing up of blood.              No-   change in color of mucus.  No- wheezing.   Skin: No-   rash or lesions. GI:  No-   heartburn, indigestion, abdominal pain, nausea, vomiting,  GU:  MS:  No-   joint pain or swelling.   Neuro-     nothing unusual Psych:  No- change in mood or affect.  No depression + anxiety.  No memory loss.  OBJ- Physical Exam General- Alert, Oriented, Affect-appropriate, Distress- none acute, looks relaxed Skin- rash-none, lesions- none, excoriation- none Lymphadenopathy- none Head- atraumatic            Eyes- Gross vision intact, PERRLA, conjunctivae and secretions clear            Ears- Hearing, canals-normal            Nose- Clear, no-Septal dev, mucus, polyps, erosion, perforation             Throat- Mallampati II-III , mucosa clear , drainage- none, tonsils- atrophic Neck- flexible , trachea midline, no stridor , thyroid nl, carotid no bruit Chest - symmetrical excursion , unlabored           Heart/CV- RRR , no murmur , no gallop  , no rub, nl s1 s2                           - JVD- none , edema- none, stasis changes- none, varices- none           Lung- clear to P&A, wheeze- none, cough- none , dullness-none, rub- none           Chest wall-  Abd-  Br/ Gen/ Rectal- Not done, not indicated Extrem- cyanosis- none, clubbing, none, atrophy- none, strength- nl Neuro- grossly intact to observation

## 2018-11-10 NOTE — Patient Instructions (Signed)
Refill sent as before for temazepam  Refill sent for amitriptyline, changing to 10 mg tabs. You can break these in half to take a dose of 15 mg, 20 mg, or 25 mg for sleep.   Please call if we can help

## 2018-12-28 NOTE — Assessment & Plan Note (Signed)
She uses medications as directed. Doing better with less work stress. Agrees to try lowering amitriptyline by changing to 10 mg tabs for 20 mg daily.

## 2018-12-28 NOTE — Assessment & Plan Note (Signed)
She understands that work and social stresses have impacted her sleep. Coping better.

## 2019-01-22 ENCOUNTER — Other Ambulatory Visit: Payer: Self-pay | Admitting: Internal Medicine

## 2019-01-23 NOTE — Telephone Encounter (Signed)
Dr. Annamaria Boots, please advise if it is okay to refill med for pt. Thanks!  No Known Allergies   Current Outpatient Medications:  .  amitriptyline (ELAVIL) 10 MG tablet, 2 at bedtime for sleep, or as directed, Disp: 60 tablet, Rfl: 5 .  cholecalciferol (VITAMIN D) 1000 units tablet, Take 1,000 Units by mouth daily., Disp: , Rfl:  .  Multiple Vitamins-Minerals (HAIR SKIN AND NAILS FORMULA PO), Take by mouth daily., Disp: , Rfl:  .  temazepam (RESTORIL) 7.5 MG capsule, TAKE 1 TO 3 CAPSULES BY MOUTH EVERY DAY, Disp: 30 capsule, Rfl: 5

## 2019-01-25 NOTE — Telephone Encounter (Signed)
Temazepam refill e-ent

## 2019-02-16 ENCOUNTER — Ambulatory Visit: Payer: Self-pay | Admitting: Internal Medicine

## 2019-04-17 ENCOUNTER — Other Ambulatory Visit: Payer: Self-pay | Admitting: Internal Medicine

## 2019-04-20 NOTE — Telephone Encounter (Signed)
Rx Request for temazepam. Pt last seen 11/10/2018 w/ CY. Temazepam 7.5 mg last filled 01/25/2019 90 capsule x1 refill.  Current Outpatient Medications on File Prior to Visit  Medication Sig Dispense Refill  . amitriptyline (ELAVIL) 10 MG tablet 2 at bedtime for sleep, or as directed 60 tablet 5  . cholecalciferol (VITAMIN D) 1000 units tablet Take 1,000 Units by mouth daily.    . Multiple Vitamins-Minerals (HAIR SKIN AND NAILS FORMULA PO) Take by mouth daily.    . temazepam (RESTORIL) 7.5 MG capsule TAKE 1 TO 3 CAPSULES BY MOUTH AT BEDTIME AS NEEDED FOR SLEEP 90 capsule 1   No current facility-administered medications on file prior to visit.    No Known Allergies   CY, please advise on the refill of this medication. Thank you.

## 2019-04-20 NOTE — Telephone Encounter (Signed)
Temazepam refill e-sent 

## 2019-05-06 ENCOUNTER — Other Ambulatory Visit: Payer: Self-pay | Admitting: Internal Medicine

## 2019-05-06 NOTE — Telephone Encounter (Signed)
Dr. Annamaria Boots, please advise if it is okay to refill med for pt. Thanks!

## 2019-05-07 NOTE — Telephone Encounter (Signed)
Refill e-sent 

## 2019-07-14 ENCOUNTER — Telehealth: Payer: Self-pay | Admitting: Internal Medicine

## 2019-07-14 NOTE — Telephone Encounter (Signed)
Spoke with pt. She is requesting a refill on Temazepam. Per our records she should have refills remaining at her pharmacy. Called [facility] and they do have these refills on file. They are going to get this ready for her. Pt is aware. Nothing further was needed.

## 2019-10-22 ENCOUNTER — Other Ambulatory Visit: Payer: Self-pay | Admitting: Internal Medicine

## 2019-10-22 NOTE — Telephone Encounter (Signed)
Requested refill e-sent 

## 2019-10-22 NOTE — Telephone Encounter (Signed)
Dr. Maple Hudson, please advise if you are okay refilling med for pt.  No Known Allergies  Current Outpatient Medications:  .  amitriptyline (ELAVIL) 10 MG tablet, TAKE 2 TABLETS BY MOUTH AT BEDTIME AS NEEDED FOR SLEEP, OR AS DIRECTED, Disp: 60 tablet, Rfl: 5 .  cholecalciferol (VITAMIN D) 1000 units tablet, Take 1,000 Units by mouth daily., Disp: , Rfl:  .  Multiple Vitamins-Minerals (HAIR SKIN AND NAILS FORMULA PO), Take by mouth daily., Disp: , Rfl:  .  temazepam (RESTORIL) 7.5 MG capsule, TAKE 1 TO 3 CAPSULES BY MOUTH AT BEDTIME AS NEEDED FOR SLEEP, Disp: 90 capsule, Rfl: 5

## 2019-11-16 ENCOUNTER — Encounter: Payer: Self-pay | Admitting: Internal Medicine

## 2019-11-16 ENCOUNTER — Ambulatory Visit: Payer: 59 | Admitting: Internal Medicine

## 2019-11-16 ENCOUNTER — Other Ambulatory Visit: Payer: Self-pay

## 2019-11-16 DIAGNOSIS — F411 Generalized anxiety disorder: Secondary | ICD-10-CM | POA: Diagnosis not present

## 2019-11-16 DIAGNOSIS — F5104 Psychophysiologic insomnia: Secondary | ICD-10-CM

## 2019-11-16 MED ORDER — AMITRIPTYLINE HCL 10 MG PO TABS: ORAL_TABLET | ORAL | 5 refills | Status: DC

## 2019-11-16 MED ORDER — TEMAZEPAM 7.5 MG PO CAPS: ORAL_CAPSULE | ORAL | 5 refills | Status: DC

## 2019-11-16 NOTE — Progress Notes (Signed)
HPI  F never smoker from [place] 24 years ago, married, followed for chronic insomnia   -----------------------------------------------------------------------------------------  11/10/2018- 54 year old female never smoker from [place] 30 years ago followed for insomnia-situational stress  temazepam 7.5, amitriptyline 25 -----no new or worsening symptoms, states she exercises, which helps her sleep Working less, which means less stress and better sleep for her. Continues to use temazepam and amitriptyline as needed with no problems. We discussed lowering dose of amitriptyline a little.  11/16/19- 54 year old female never smoker from [place] 30 years ago followed for insomnia-situational stress  temazepam 7.5, amitriptyline 25 -----no cough/ wheezing/ SOB Still working harder than she wants, but says sleep is ok. meds work well, needing refills but not changes. No change in her general health to report.   ROS-see HPI + = positive Constitutional:   No-   weight loss, night sweats, fevers, chills,+fatigue, lassitude. HEENT:   No-  headaches, difficulty swallowing, tooth/dental problems, sore throat,       No-  sneezing, itching, ear ache, nasal congestion, post nasal drip,  CV:  No-   chest pain, orthopnea, PND, swelling in lower extremities, anasarca,  dizziness, palpitations Resp: No-   shortness of breath with exertion or at rest.              No-   productive cough,  No non-productive cough,  No- coughing up of blood.              No-   change in color of mucus.  No- wheezing.   Skin: No-   rash or lesions. GI:  No-   heartburn, indigestion, abdominal pain, nausea, vomiting,  GU:  MS:  No-   joint pain or swelling.   Neuro-     nothing unusual Psych:  No- change in mood or affect. No depression + anxiety.  No memory loss.  OBJ- Physical Exam General- Alert, Oriented, Affect-appropriate, Distress- none acute, looks relaxed Skin- rash-none, lesions- none, excoriation-  none Lymphadenopathy- none Head- atraumatic            Eyes- Gross vision intact, PERRLA, conjunctivae and secretions clear            Ears- Hearing, canals-normal            Nose- Clear, no-Septal dev, mucus, polyps, erosion, perforation             Throat- Mallampati II-III , mucosa clear , drainage- none, tonsils- atrophic Neck- flexible , trachea midline, no stridor , thyroid nl, carotid no bruit Chest - symmetrical excursion , unlabored           Heart/CV- RRR , no murmur , no gallop  , no rub, nl s1 s2                           - JVD- none , edema- none, stasis changes- none, varices- none           Lung- clear to P&A, wheeze- none, cough- none , dullness-none, rub- none           Chest wall-  Abd-  Br/ Gen/ Rectal- Not done, not indicated Extrem- cyanosis- none, clubbing, none, atrophy- none, strength- nl Neuro- grossly intact to observation

## 2019-11-16 NOTE — Assessment & Plan Note (Signed)
Seems calmer, more self-assured Plan- continue amitriptyline

## 2019-11-16 NOTE — Patient Instructions (Signed)
Medication refills were sent to [facility]  Please call if we can help

## 2019-11-16 NOTE — Assessment & Plan Note (Signed)
Primary insomnia, aggravated by [place] country, language, stressful job. Doing much better in recent years. Meds work well. Plan- discussed meds. Refilled amitriptyline and temazepam.

## 2020-05-11 ENCOUNTER — Other Ambulatory Visit: Payer: Self-pay | Admitting: Internal Medicine

## 2020-05-16 ENCOUNTER — Telehealth: Payer: Self-pay | Admitting: Internal Medicine

## 2020-05-16 MED ORDER — TEMAZEPAM 7.5 MG PO CAPS: ORAL_CAPSULE | ORAL | 5 refills | Status: DC

## 2020-05-16 NOTE — Telephone Encounter (Signed)
Called and spoke with patient.  She needs refill of temazepam. Preferred [facility] [address]  Dr. Maple Hudson please advise.

## 2020-05-16 NOTE — Telephone Encounter (Signed)
Temazepam refill e-sent 

## 2020-07-27 ENCOUNTER — Other Ambulatory Visit: Payer: Self-pay | Admitting: Internal Medicine

## 2020-11-07 ENCOUNTER — Other Ambulatory Visit (HOSPITAL_COMMUNITY)
Admission: RE | Admit: 2020-11-07 | Discharge: 2020-11-07 | Disposition: A | Discharge status: Home / Self Care | Payer: 59 | Source: Ambulatory Visit | Attending: Obstetrics and Gynecology | Admitting: Obstetrics and Gynecology

## 2020-11-07 ENCOUNTER — Encounter: Payer: Self-pay | Admitting: Obstetrics and Gynecology

## 2020-11-07 ENCOUNTER — Ambulatory Visit (INDEPENDENT_AMBULATORY_CARE_PROVIDER_SITE_OTHER): Payer: 59 | Admitting: Obstetrics and Gynecology

## 2020-11-07 ENCOUNTER — Other Ambulatory Visit: Payer: Self-pay

## 2020-11-07 ENCOUNTER — Telehealth: Payer: Self-pay | Admitting: Obstetrics and Gynecology

## 2020-11-07 VITALS — BP 108/70 | HR 68 | Ht 62.0 in | Wt 119.0 lb

## 2020-11-07 DIAGNOSIS — Z124 Encounter for screening for malignant neoplasm of cervix: Secondary | ICD-10-CM

## 2020-11-07 DIAGNOSIS — Z1211 Encounter for screening for malignant neoplasm of colon: Secondary | ICD-10-CM

## 2020-11-07 DIAGNOSIS — Z01419 Encounter for gynecological examination (general) (routine) without abnormal findings: Secondary | ICD-10-CM | POA: Diagnosis not present

## 2020-11-07 DIAGNOSIS — N950 Postmenopausal bleeding: Secondary | ICD-10-CM | POA: Diagnosis not present

## 2020-11-07 NOTE — Patient Instructions (Signed)
EXERCISE AND DIET:  We recommended that you start or continue a regular exercise program for good health. Regular exercise means any activity that makes your heart beat faster and makes you sweat.  We recommend exercising at least 30 minutes per day at least 3 days a week, preferably 4 or 5.  We also recommend a diet low in fat and sugar.  Inactivity, poor dietary choices and obesity can cause diabetes, heart attack, stroke, and kidney damage, among others.    ALCOHOL AND SMOKING:  Women should limit their alcohol intake to no more than 7 drinks/beers/glasses of wine (combined, not each!) per week. Moderation of alcohol intake to this level decreases your risk of breast cancer and liver damage. And of course, no recreational drugs are part of a healthy lifestyle.  And absolutely no smoking or even second hand smoke. Most people know smoking can cause heart and lung diseases, but did you know it also contributes to weakening of your bones? Aging of your skin?  Yellowing of your teeth and nails?  CALCIUM AND VITAMIN D:  Adequate intake of calcium and Vitamin D are recommended.  The recommendations for exact amounts of these supplements seem to change often, but generally speaking 600 mg of calcium (either carbonate or citrate) and 800 units of Vitamin D per day seems prudent. Certain women may benefit from higher intake of Vitamin D.  If you are among these women, your doctor will have told you during your visit.    PAP SMEARS:  Pap smears, to check for cervical cancer or precancers,  have traditionally been done yearly, although recent scientific advances have shown that most women can have pap smears less often.  However, every woman still should have a physical exam from her gynecologist every year. It will include a breast check, inspection of the vulva and vagina to check for abnormal growths or skin changes, a visual exam of the cervix, and then an exam to evaluate the size and shape of the uterus and  ovaries.  And after 55 years of age, a rectal exam is indicated to check for rectal cancers. We will also provide age appropriate advice regarding health maintenance, like when you should have certain vaccines, screening for sexually transmitted diseases, bone density testing, colonoscopy, mammograms, etc.   MAMMOGRAMS:  All women over 55 years old should have a yearly mammogram. Many facilities now offer a "3D" mammogram, which may cost around $50 extra out of pocket. If possible,  we recommend you accept the option to have the 3D mammogram performed.  It both reduces the number of women who will be called back for extra views which then turn out to be normal, and it is better than the routine mammogram at detecting truly abnormal areas.    COLONOSCOPY:  Colonoscopy to screen for colon cancer is recommended for all women at age 55.  We know, you hate the idea of the prep.  We agree, BUT, having colon cancer and not knowing it is worse!!  Colon cancer so often starts as a polyp that can be seen and removed at colonscopy, which can quite literally save your life!  And if your first colonoscopy is normal and you have no family history of colon cancer, most women don't have to have it again for 10 years.  Once every ten years, you can do something that may end up saving your life, right?  We will be happy to help you get it scheduled when you are ready.    Be sure to check your insurance coverage so you understand how much it will cost.  It may be covered as a preventative service at no cost, but you should check your particular policy.      Endometrial Biopsy  An endometrial biopsy is a procedure to remove tissue samples from the endometrium, which is the lining of the uterus. The tissue that is removed can then be checked under a microscope for disease. This procedure is used to diagnose conditions such as endometrial cancer, endometrial tuberculosis, polyps, or other inflammatory conditions. This procedure  may also be used to investigate uterine bleeding to determine where you are in your menstrual cycle or how your hormone levels are affecting the lining of the uterus. Tell a health care provider about:  Any allergies you have.  All medicines you are taking, including vitamins, herbs, eye drops, creams, and over-the-counter medicines.  Any problems you or family members have had with anesthetic medicines.  Any blood disorders you have.  Any surgeries you have had.  Any medical conditions you have.  Whether you are pregnant or may be pregnant. What are the risks? Generally, this is a safe procedure. However, problems may occur, including:  Bleeding.  Pelvic infection.  Puncture of the wall of the uterus with the biopsy device (rare).  Allergic reactions to medicines. What happens before the procedure?  Keep a record of your menstrual cycles as told by your health care provider. You may need to schedule your procedure for a specific time in your cycle.  You may want to bring a sanitary pad to wear after the procedure.  Plan to have someone take you home from the hospital or clinic.  Ask your health care provider about: ? Changing or stopping your regular medicines. This is especially important if you are taking diabetes medicines, arthritis medicines, or blood thinners. ? Taking medicines such as aspirin and ibuprofen. These medicines can thin your blood. Do not take these medicines unless your health care provider tells you to take them. ? Taking over-the-counter medicines, vitamins, herbs, and supplements. What happens during the procedure?  You will lie on an exam table with your feet and legs supported as in a pelvic exam.  Your health care provider will insert an instrument (speculum) into your vagina to see your cervix.  Your cervix will be cleansed with an antiseptic solution.  A medicine (local anesthetic) will be used to numb the cervix.  A forceps instrument  (tenaculum) will be used to hold your cervix steady for the biopsy.  A thin, rod-like instrument (uterine sound) will be inserted through your cervix to determine the length of your uterus and the location where the biopsy sample will be removed.  A thin, flexible tube (catheter) will be inserted through your cervix and into the uterus. The catheter will be used to collect the biopsy sample from your endometrial tissue.  The catheter and speculum will then be removed, and the tissue sample will be sent to a lab for examination. The procedure may vary among health care providers and hospitals. What can I expect after procedure?  You will rest in a recovery area until you are ready to go home.  You may have mild cramping and a small amount of vaginal bleeding. This is normal.  You may have a small amount of vaginal bleeding for a few days. This is normal.  It is up to you to get the results of your procedure. Ask your health care provider, or  the department that is doing the procedure, when your results will be ready. Follow these instructions at home:  Take over-the-counter and prescription medicines only as told by your health care provider.  Do not douche, use tampons, or have sexual intercourse until your health care provider approves.  Return to your normal activities as told by your health care provider. Ask your health care provider what activities are safe for you.  Follow instructions from your health care provider about any activity restrictions, such as restrictions on strenuous exercise or heavy lifting.  Keep all follow-up visits. This is important. Contact a health care provider:  You have heavy bleeding, or bleed for longer than 2 days after the procedure.  You have bad smelling discharge from your vagina.  You have a fever or chills.  You have a burning sensation when urinating or you have difficulty urinating.  You have severe pain in your lower abdomen. Get help  right away if you:  You have severe cramps in your stomach or back.  You pass large blood clots.  Your bleeding increases.  You become weak or light-headed, or you faint or lose consciousness. Summary  An endometrial biopsy is a procedure to remove tissue samples is taken from the endometrium, which is the lining of the uterus.  The tissue sample that is removed will be checked under a microscope for disease.  This procedure is used to diagnose conditions such as endometrial cancer, endometrial tuberculosis, polyps, or other inflammatory conditions.  After the procedure, it is common to have mild cramping and a small amount of vaginal bleeding for a few days.  Do not douche, use tampons, or have sexual intercourse until your health care provider approves. Ask your health care provider which activities are safe for you. This information is not intended to replace advice given to you by your health care provider. Make sure you discuss any questions you have with your health care provider. Document Revised: 01/04/2020 Document Reviewed: 01/04/2020 Elsevier Patient Education  2021 Elsevier Inc.  Abdominal or Pelvic Ultrasound An ultrasound is a test that uses sound waves to take pictures of the inside of the body. An abdominal ultrasound takes pictures of the inside of your belly (abdomen). A pelvic ultrasound takes pictures of the inside of the area between your hip bones (pelvis). An ultrasound may be done to check an organ or look for problems. This is a safe test that does not hurt. It is done by placing a handheld device called a transducer on the outside of your belly or pelvis and moving it around. Tell your doctor about:  Any allergies you have.  All medicines you are taking.  Any surgeries you have had.  Any medical conditions you have.  Whether you are pregnant or may be pregnant. What are the risks? There are no known risks from having this test. What happens before  the procedure?  Follow instructions from your doctor about eating or drinking before the test.  Wear clothing that is easy to wash. Gel from the test might get on your clothes. What happens during the procedure?  You will lie on an exam table.  Your clothes will be moved so your belly and pelvis are showing.  A gel will be put on your skin. It may feel cool.  The transducer device will be put on your skin. It will be moved back and forth over the area being looked at.  The device will take pictures. They will show on  small TV screens.  You may be asked to change your position.  After the exam, the gel will be cleaned off.   What happens after the procedure?  It is up to you to get the results of your test. Ask your doctor, or the department that is doing the test, when your results will be ready.  Keep all follow-up visits as told by your doctor. This is important. Summary  An ultrasound is a test that uses sound waves to take pictures of the inside of the body.  An ultrasound may be done to check an organ or look for problems.  The test is done by moving a handheld device around on the outside of your belly or pelvis.  It is up to you to get the results of your test. Be sure to ask when your results will be ready. This information is not intended to replace advice given to you by your health care provider. Make sure you discuss any questions you have with your health care provider. Document Revised: 01/06/2018 Document Reviewed: 01/06/2018 Elsevier Patient Education  2021 Elsevier Inc.  Postmenopausal Bleeding Postmenopausal bleeding is any bleeding that occurs after menopause. Menopause is a time in a woman's life when monthly periods stop. Any type of bleeding after menopause should be checked by your doctor. Treatment will depend on the cause. This kind of bleeding can be caused by:  Taking hormones during menopause.  Low or high amounts of female hormones in the  body. This can cause the lining of the womb (uterus) to become too thin or too thick.  Cancer.  Growths in the womb that are not cancer. Follow these instructions at home:  Watch for any changes in your symptoms. Let your doctor know about them.  Avoid using tampons and douches as told by your doctor.  Change your pads regularly.  Get regular pelvic exams. This includes Pap tests.  Take iron pills as told by your doctor.  Take over-the-counter and prescription medicines only as told by your doctor.  Keep all follow-up visits.   Contact a doctor if:  You have new bleeding from the vagina after menopause.  You have pain in your belly (abdomen). Get help right away if:  You have a fever or chills.  You have very bad pain with bleeding.  You have clumps of blood (blood clots) coming from your vagina.  You have a lot of bleeding, and: ? You use more than 1 pad an hour. ? This kind of bleeding has never happened before.  You have headaches.  You feel dizzy or you feel like you are going to pass out (faint). Summary  Any type of bleeding after menopause should be checked by your doctor.  Avoid using tampons or douches.  Get regular pelvic exams. This includes Pap tests.  Contact a doctor if you have new bleeding or pain in your belly.  Watch for any changes in your symptoms. Let your doctor know about them. This information is not intended to replace advice given to you by your health care provider. Make sure you discuss any questions you have with your health care provider. Document Revised: 11/26/2019 Document Reviewed: 11/26/2019 Elsevier Patient Education  2021 [employer].

## 2020-11-07 NOTE — Progress Notes (Signed)
55 y.o. [id number] Married [place] female here for annual exam.    Patient having a "different sensation" in vagina--hard to describe.  ?prolpase.   Occurring for a few months. Leaks a little urine with sneeze.  No pad use.  No bowel function issues.   Had vaginal bleeding two days ago in a small amount.  No pain.  Last intercourse was 2 - 3 weeks ago.   UTI 2 months ago.   No pain with urination.   PCP:  Zoe Lan, NP  Patient's last menstrual period was 07/10/2016 (approximate).           Sexually active: Yes.    The current method of family planning is post menopausal status.    Exercising: No.  walking and working Smoker:  no  Health Maintenance: Pap: 11-06-17 Neg:Neg HR HPV, 08/07/13 Neg:Neg HR HPV History of abnormal Pap:  no MMG:  11-25-17 3D/Neg/BiRads1--knows to schedule Colonoscopy:  NEVER BMD:   n/a  Result  n/a TDaP:  12-07-11 Gardasil:   no HIV: 08-15-15 NR Hep C: UNSURE Screening Labs:  PCP   reports that she has never smoked. She has never used smokeless tobacco. She reports that she does not drink alcohol and does not use drugs.  Past Medical History:  Diagnosis Date  . Anxiety   . Hyperlipidemia   . Low vitamin D level     Past Surgical History:  Procedure Laterality Date  . BREAST ENHANCEMENT SURGERY Bilateral 10 years ago   Breast Lift    Current Outpatient Medications  Medication Sig Dispense Refill  . amitriptyline (ELAVIL) 10 MG tablet TAKE 2 TABLETS BY MOUTH AT BEDTIME AS NEEDED FOR SLEEP, OR AS DIRECTED 60 tablet 5  . cholecalciferol (VITAMIN D) 1000 units tablet Take 1,000 Units by mouth daily.    . temazepam (RESTORIL) 7.5 MG capsule 1-3 for sleep as needed 90 capsule 5   No current facility-administered medications for this visit.    Family History  Problem Relation Age of Onset  . Anemia Father   . Stroke Mother     Review of Systems  All other systems reviewed and are negative.   Exam:   BP 108/70   Pulse 68   Ht 5\' 2"   (1.575 m)   Wt 119 lb (54 kg)   LMP 07/10/2016 (Approximate)   SpO2 99%   BMI 21.77 kg/m     General appearance: alert, cooperative and appears stated age Head: normocephalic, without obvious abnormality, atraumatic Neck: no adenopathy, supple, symmetrical, trachea midline and thyroid normal to inspection and palpation Lungs: clear to auscultation bilaterally Breasts: consistent with prior surgery, no masses or tenderness, No nipple retraction or dimpling, No nipple discharge or bleeding, No axillary adenopathy Heart: regular rate and rhythm Abdomen: soft, non-tender; no masses, no organomegaly Extremities: extremities normal, atraumatic, no cyanosis or edema Skin: skin color, texture, turgor normal. No rashes or lesions Lymph nodes: cervical, supraclavicular, and axillary nodes normal. Neurologic: grossly normal  Pelvic: External genitalia:  no lesions              No abnormal inguinal nodes palpated.              Urethra:  normal appearing urethra with no masses, tenderness or lesions              Bartholins and Skenes: normal                 Vagina: normal appearing vagina with normal color and discharge,  no lesions              Cervix: no lesions              Pap taken: Yes.   Bimanual Exam:  Uterus:  normal size, contour, position, consistency, mobility, non-tender              Adnexa: no mass, fullness, tenderness              Rectal exam: Yes.  .  Confirms.              Anus:  normal sphincter tone, no lesions  Chaperone was present for exam.  Assessment:   Well woman visit with normal exam. Status post breast enhancement surgery.  Recent postmenopausal bleeding.  Colon cancer screening.   Plan: Mammogram screening discussed.  She will schedule.  Self breast awareness reviewed. Pap and HR HPV as above. Guidelines for Calcium, Vitamin D, regular exercise program including cardiovascular and weight bearing exercise. Postmenopausal bleeding discussed and potential  etiologies - atrophy, polyps, ovarian cysts, cancer.  Return for pelvic ultrasound and possible endometrial biopsy.  Take ibuprofen 800 mg prior to appointment.  Colonoscopy referral placed.  Follow up annually and prn.   After visit summary provided.

## 2020-11-08 ENCOUNTER — Telehealth: Payer: Self-pay

## 2020-11-08 NOTE — Telephone Encounter (Signed)
Patient called to clarify does of Ibuprofen and when to take it prior to next appt.   Per Chart note "Take ibuprofen 800 mg prior to appointment."  I recommended she take it 30 minutes prior to appointment.

## 2020-11-09 ENCOUNTER — Telehealth: Payer: Self-pay | Admitting: Internal Medicine

## 2020-11-09 MED ORDER — TEMAZEPAM 7.5 MG PO CAPS: ORAL_CAPSULE | ORAL | 5 refills | Status: DC

## 2020-11-09 NOTE — Telephone Encounter (Signed)
Temazepm refilled

## 2020-11-09 NOTE — Telephone Encounter (Signed)
Called and spoke to pt. Pt is requesting a refill of her Temazepam 7.5mg .  Last refilled on 05/16/2020 for #90 with 5 refills (taking 1-3 for sleep as needed), filled by Dr. Maple Hudson.  Pt last seen on 11/16/2019 by Dr. Maple Hudson and advised to follow up in a year.  Pt has upcoming appt on 12/19/2020 with CY and is aware to keep it.   Dr. Maple Hudson, please advise on refill. Thanks.   No Known Allergies  Current Outpatient Medications on File Prior to Visit  Medication Sig Dispense Refill  . amitriptyline (ELAVIL) 10 MG tablet TAKE 2 TABLETS BY MOUTH AT BEDTIME AS NEEDED FOR SLEEP, OR AS DIRECTED 60 tablet 5  . cholecalciferol (VITAMIN D) 1000 units tablet Take 1,000 Units by mouth daily.    . temazepam (RESTORIL) 7.5 MG capsule 1-3 for sleep as needed 90 capsule 5   No current facility-administered medications on file prior to visit.

## 2020-11-09 NOTE — Telephone Encounter (Signed)
Called and spoke with patient, she is aware that Temazepam has been sent into the pharmacy. Patient voiced understanding.  Nothing further needed at this time.

## 2020-11-11 LAB — CYTOLOGY - PAP
Comment: NEGATIVE
Diagnosis: NEGATIVE
High risk HPV: NEGATIVE

## 2020-11-24 NOTE — Progress Notes (Deleted)
GYNECOLOGY  VISIT   HPI: 55 y.o.   Married Asian female   [phone] with Patient's last menstrual period was 08/23/2016 (within months).   here for     GYNECOLOGIC HISTORY: Patient's last menstrual period was 08/23/2016 (within months). Contraception: post menopausal status. Menopausal hormone therapy: none Last mammogram:  11-25-17 3D/Neg/BiRads1 Last pap smear:  11/07/2020 Neg        OB History    Gravida  3   Para  2   Term  2   Preterm  0   AB  1   Living  2     SAB  0   IAB  1   Ectopic  0   Multiple  0   Live Births  2              Patient Active Problem List   Diagnosis Date Noted  . Anxiety state 10/30/2016  . Chronic insomnia 05/31/2013    Past Medical History:  Diagnosis Date  . Anxiety   . Hyperlipidemia   . Low vitamin D level     Past Surgical History:  Procedure Laterality Date  . BREAST ENHANCEMENT SURGERY Bilateral 10 years ago   Breast Lift    Current Outpatient Medications  Medication Sig Dispense Refill  . amitriptyline (ELAVIL) 10 MG tablet TAKE 2 TABLETS BY MOUTH AT BEDTIME AS NEEDED FOR SLEEP, OR AS DIRECTED 60 tablet 5  . cholecalciferol (VITAMIN D) 1000 units tablet Take 1,000 Units by mouth daily.    . temazepam (RESTORIL) 7.5 MG capsule 1-3 for sleep as needed 90 capsule 5   No current facility-administered medications for this visit.     ALLERGIES: Patient has no known allergies.  Family History  Problem Relation Age of Onset  . Anemia Father   . Stroke Mother     Social History   Socioeconomic History  . Marital status: Married    Spouse name: Not on file  . Number of children: 2  . Years of education: Not on file  . Highest education level: Not on file  Occupational History  . Occupation: tailor seamtress  Tobacco Use  . Smoking status: Never Smoker  . Smokeless tobacco: Never Used  Vaping Use  . Vaping Use: Never used  Substance and Sexual Activity  . Alcohol use: No    Alcohol/week: 0.0 standard  drinks  . Drug use: No  . Sexual activity: Yes    Partners: Male    Birth control/protection: None, Post-menopausal  Other Topics Concern  . Not on file  Social History Narrative  . Not on file   Social Determinants of Health   Financial Resource Strain: Not on file  Food Insecurity: Not on file  Transportation Needs: Not on file  Physical Activity: Not on file  Stress: Not on file  Social Connections: Not on file  Intimate Partner Violence: Not on file    Review of Systems  PHYSICAL EXAMINATION:    LMP 08/23/2016 (Within Months)     General appearance: alert, cooperative and appears stated age Head: Normocephalic, without obvious abnormality, atraumatic Neck: no adenopathy, supple, symmetrical, trachea midline and thyroid normal to inspection and palpation Lungs: clear to auscultation bilaterally Breasts: normal appearance, no masses or tenderness, No nipple retraction or dimpling, No nipple discharge or bleeding, No axillary or supraclavicular adenopathy Heart: regular rate and rhythm Abdomen: soft, non-tender, no masses,  no organomegaly Extremities: extremities normal, atraumatic, no cyanosis or edema Skin: Skin color, texture, turgor normal. No  rashes or lesions Lymph nodes: Cervical, supraclavicular, and axillary nodes normal. No abnormal inguinal nodes palpated Neurologic: Grossly normal  Pelvic: External genitalia:  no lesions              Urethra:  normal appearing urethra with no masses, tenderness or lesions              Bartholins and Skenes: normal                 Vagina: normal appearing vagina with normal color and discharge, no lesions              Cervix: no lesions                Bimanual Exam:  Uterus:  normal size, contour, position, consistency, mobility, non-tender              Adnexa: no mass, fullness, tenderness              Rectal exam: {yes no:[id number]}.  Confirms.              Anus:  normal sphincter tone, no lesions  Chaperone was present  for exam.  ASSESSMENT     PLAN     An After Visit Summary was printed and given to the patient.  ______ minutes face to face time of which over 50% was spent in counseling.

## 2020-12-08 ENCOUNTER — Other Ambulatory Visit: Payer: 59 | Admitting: Obstetrics and Gynecology

## 2020-12-08 ENCOUNTER — Other Ambulatory Visit: Payer: Self-pay

## 2020-12-08 ENCOUNTER — Ambulatory Visit: Payer: 59

## 2020-12-11 ENCOUNTER — Telehealth: Payer: Self-pay | Admitting: Obstetrics and Gynecology

## 2020-12-11 NOTE — Telephone Encounter (Signed)
Please reschedule patient's visit.   Patient did not keep her appointment on 12/08/20 for evaluation of postmenopausal bleeding.   I recommend pelvic ultrasound at [facility] and then a follow up appointment with me.

## 2020-12-13 NOTE — Telephone Encounter (Signed)
Called patient and informed with below. Patient said she cancelled her appointment because when she can to the office is when she found out she will have to pay over $700 for ultrasound and she did not have the money that day. Patient said her husband is checking with insurance about a preferred location and patient reports she will call back with this information.

## 2020-12-19 ENCOUNTER — Ambulatory Visit: Payer: 59 | Admitting: Internal Medicine

## 2021-01-09 NOTE — Telephone Encounter (Signed)
Opened in error

## 2021-01-20 NOTE — Progress Notes (Signed)
HPI  F never smoker from [place] 24 years ago, married, followed for chronic insomnia   -----------------------------------------------------------------------------------------   11/16/19- 55 year old female never smoker from [place] 30 years ago followed for insomnia-situational stress  temazepam 7.5, amitriptyline 25 -----no cough/ wheezing/ SOB Still working harder than she wants, but says sleep is ok. meds work well, needing refills but not changes. No change in her general health to report.   01/23/21- 55 year old female never smoker from [place] 30 years ago followed for insomnia-situational stress  -temazepam 7.5, amitriptyline 25 Covid vax -2 Phizer Chronic insomnia is better. She reports sleeping better. Still working hard, but less stress and life is going better for her. Asks temazepam refill- has been successful.  ROS-see HPI + = positive Constitutional:   No-   weight loss, night sweats, fevers, chills,+fatigue, lassitude. HEENT:   No-  headaches, difficulty swallowing, tooth/dental problems, sore throat,       No-  sneezing, itching, ear ache, nasal congestion, post nasal drip,  CV:  No-   chest pain, orthopnea, PND, swelling in lower extremities, anasarca,  dizziness, palpitations Resp: No-   shortness of breath with exertion or at rest.              No-   productive cough,  No non-productive cough,  No- coughing up of blood.              No-   change in color of mucus.  No- wheezing.   Skin: No-   rash or lesions. GI:  No-   heartburn, indigestion, abdominal pain, nausea, vomiting,  GU:  MS:  No-   joint pain or swelling.   Neuro-     nothing unusual Psych:  No- change in mood or affect. No depression + anxiety.  No memory loss.  OBJ- Physical Exam General- Alert, Oriented, Affect-appropriate, Distress- none acute, +looks relaxed Skin- rash-none, lesions- none, excoriation- none Lymphadenopathy- none Head- atraumatic            Eyes- Gross vision intact, PERRLA,  conjunctivae and secretions clear            Ears- Hearing, canals-normal            Nose- Clear, no-Septal dev, mucus, polyps, erosion, perforation             Throat- Mallampati II-III , mucosa clear , drainage- none, tonsils- atrophic Neck- flexible , trachea midline, no stridor , thyroid nl, carotid no bruit Chest - symmetrical excursion , unlabored           Heart/CV- RRR , no murmur , no gallop  , no rub, nl s1 s2                           - JVD- none , edema- none, stasis changes- none, varices- none           Lung- clear to P&A, wheeze- none, cough- none , dullness-none, rub- none           Chest wall-  Abd-  Br/ Gen/ Rectal- Not done, not indicated Extrem- cyanosis- none, clubbing, none, atrophy- none, strength- nl Neuro- grossly intact to observation

## 2021-01-23 ENCOUNTER — Other Ambulatory Visit: Payer: Self-pay

## 2021-01-23 ENCOUNTER — Ambulatory Visit: Payer: 59 | Admitting: Internal Medicine

## 2021-01-23 ENCOUNTER — Encounter: Payer: Self-pay | Admitting: Internal Medicine

## 2021-01-23 DIAGNOSIS — F411 Generalized anxiety disorder: Secondary | ICD-10-CM

## 2021-01-23 DIAGNOSIS — F5104 Psychophysiologic insomnia: Secondary | ICD-10-CM | POA: Diagnosis not present

## 2021-01-23 MED ORDER — TEMAZEPAM 7.5 MG PO CAPS: ORAL_CAPSULE | ORAL | 5 refills | Status: DC

## 2021-01-23 NOTE — Patient Instructions (Signed)
Temazepam refilled  Please call if we can help

## 2021-01-25 NOTE — Assessment & Plan Note (Signed)
She indicates life is less stressful and she is doing better now.

## 2021-01-25 NOTE — Assessment & Plan Note (Signed)
Managing much better Plan- refill temazepam, continue good sleep habit

## 2021-02-01 NOTE — Telephone Encounter (Signed)
No return call from patient.  MyChart not active.  PUS not scheduled to date.  Call placed to patient, Left message to call Noreene Larsson, RN at [place], [phone].

## 2021-03-03 NOTE — Telephone Encounter (Signed)
Left message to call Noreene Larsson, RN at [place], [phone] to review options for scheduling recommended PUS.

## 2021-03-09 NOTE — Telephone Encounter (Signed)
No return call from patient.   Dr. Edward Jolly -please advise

## 2021-03-14 NOTE — Telephone Encounter (Signed)
I recommend a 30 day letter and trying to work with the patient so she can get the care she needs for evaluation of postmenopausal bleeding.

## 2021-03-15 NOTE — Telephone Encounter (Signed)
30 day letter reviewed and signed by Dr. Edward Jolly.   Letter to front office to be mailed.   Encounter closed.

## 2021-03-15 NOTE — Telephone Encounter (Signed)
Letter printed and to Dr. Edward Jolly to review.

## 2021-03-21 ENCOUNTER — Telehealth: Payer: Self-pay

## 2021-03-21 NOTE — Telephone Encounter (Signed)
Call to patient to reschedule ultrasound and endometrial biopsy appointment. Patient stated she is not ready to have the appointment yet. Patient stated that she has an appointment somewhere else to "check it out."   Dr. Edward Jolly, OK to cancel Ultrasound and EMB order?

## 2021-03-22 NOTE — Telephone Encounter (Signed)
Forwarding this message to nursing supervisor.  Patient has received a 30 day letter.

## 2021-03-22 NOTE — Telephone Encounter (Signed)
30 day letter mailed on 03/15/21.   See telephone encounter dated 12/11/20.   Encounter closed.

## 2021-05-31 ENCOUNTER — Telehealth: Payer: Self-pay | Admitting: Internal Medicine

## 2021-06-01 MED ORDER — TEMAZEPAM 7.5 MG PO CAPS: ORAL_CAPSULE | ORAL | 5 refills | Status: DC

## 2021-06-01 NOTE — Telephone Encounter (Signed)
Last OV was 01/2021. Next OV was 01/2022. Please advise on refill for Tenazepam.

## 2021-06-01 NOTE — Telephone Encounter (Signed)
I left a message that refill was sent to the pharmacy. Nothing further needed.

## 2021-06-01 NOTE — Telephone Encounter (Signed)
Temazepam refilled.

## 2021-06-26 ENCOUNTER — Other Ambulatory Visit: Payer: Self-pay | Admitting: Internal Medicine

## 2021-06-28 NOTE — Telephone Encounter (Signed)
Amitriptyline  refilled

## 2021-06-28 NOTE — Telephone Encounter (Signed)
Please advise on med refill.  No Known Allergies   Current Outpatient Medications:    amitriptyline (ELAVIL) 10 MG tablet, TAKE 2 TABLETS BY MOUTH AT BEDTIME AS NEEDED FOR SLEEP, OR AS DIRECTED, Disp: 60 tablet, Rfl: 5   cholecalciferol (VITAMIN D) 1000 units tablet, Take 1,000 Units by mouth daily. (Patient not taking: Reported on 01/23/2021), Disp: , Rfl:    fluconazole (DIFLUCAN) 150 MG tablet, Take 150 mg by mouth once., Disp: , Rfl:    sulfamethoxazole-trimethoprim (BACTRIM DS) 800-160 MG tablet, Take 1 tablet by mouth 2 (two) times daily., Disp: , Rfl:    temazepam (RESTORIL) 7.5 MG capsule, 1-3 for sleep as needed, Disp: 90 capsule, Rfl: 5

## 2021-08-17 ENCOUNTER — Telehealth: Payer: Self-pay | Admitting: Internal Medicine

## 2021-08-17 MED ORDER — TEMAZEPAM 7.5 MG PO CAPS: ORAL_CAPSULE | ORAL | 5 refills | Status: DC

## 2021-08-17 NOTE — Telephone Encounter (Signed)
Patient is aware and voiced her understanding.  °Nothing further needed.  °

## 2021-08-17 NOTE — Telephone Encounter (Signed)
Spoke to patient.  She is requesting a refill on Temazepam 7.5mg . last refilled 06/01/2021 #90 with 0 refills.  Last seen 01/2021  Dr. Maple Hudson, please advise. Thanks

## 2021-08-17 NOTE — Telephone Encounter (Signed)
Temazepam refilled [facility]

## 2021-08-21 ENCOUNTER — Telehealth: Payer: Self-pay | Admitting: Internal Medicine

## 2021-08-22 MED ORDER — ZOLPIDEM TARTRATE 5 MG PO TABS: ORAL_TABLET | ORAL | 5 refills | Status: DC

## 2021-08-22 NOTE — Telephone Encounter (Signed)
Spoke to patient.  She stated that Temazepam is not covered by insurance. Preferred medications are eszopiclone and zolpidem.  Dr. Maple Hudson, please advise. Thanks

## 2021-08-22 NOTE — Telephone Encounter (Signed)
Script for zolpidem sent to [facility]

## 2021-08-22 NOTE — Telephone Encounter (Signed)
Patient is aware of below message and voiced her understanding.  Nothing further needed.   

## 2021-09-05 ENCOUNTER — Telehealth: Payer: Self-pay | Admitting: Internal Medicine

## 2021-09-05 NOTE — Telephone Encounter (Signed)
Lm for patient.  

## 2021-09-07 NOTE — Telephone Encounter (Signed)
Spoke to patient.  ?She would like to increase Ambien from once a day to BID. She feels that she is not getting the full affect with one tablet.  ? ?Dr. Annamaria Boots, please advise. Thanks ?

## 2021-09-07 NOTE — Telephone Encounter (Signed)
Spoke to patient and relayed below message.  ?She stated that she is currently taking Ambien 5mg  and Elavil 10mg . She is concerned about starting new medications.  ?She would like to discuss this further with Dr. . Appt scheduled 09/08/2021 at 9:00. ?Routing to Dr. Maple Hudson as an 09/10/2021. ?Nothing further needed.  ?

## 2021-09-07 NOTE — Telephone Encounter (Signed)
10 mg of Ambien is above the recommended safe dose for women.  ?If she agrees, I would like her to try eszopiclone( Lunesta) instead. If she agrees, LMK and I will send it in.  ?

## 2021-09-08 ENCOUNTER — Other Ambulatory Visit: Payer: Self-pay

## 2021-09-08 ENCOUNTER — Encounter: Payer: Self-pay | Admitting: Internal Medicine

## 2021-09-08 ENCOUNTER — Ambulatory Visit (INDEPENDENT_AMBULATORY_CARE_PROVIDER_SITE_OTHER): Payer: BLUE CROSS/BLUE SHIELD | Admitting: Internal Medicine

## 2021-09-08 DIAGNOSIS — F5104 Psychophysiologic insomnia: Secondary | ICD-10-CM

## 2021-09-08 DIAGNOSIS — F411 Generalized anxiety disorder: Secondary | ICD-10-CM | POA: Diagnosis not present

## 2021-09-08 MED ORDER — ESZOPICLONE 1 MG PO TABS: ORAL_TABLET | ORAL | 1 refills | Status: DC

## 2021-09-08 NOTE — Assessment & Plan Note (Signed)
Typical cycle-her anxiety makes it harder for her to sleep and if she does not sleep well that increases her anxiety.  We have talked through this. ?

## 2021-09-08 NOTE — Progress Notes (Signed)
HPI ? ?F never smoker from [place] 24 years ago, married, followed for chronic insomnia  ? ?----------------------------------------------------------------------------------------- ?  ? ?01/23/21- 56 year old female never smoker from [place] 30 years ago followed for insomnia-situational stress ? -temazepam 7.5, amitriptyline 25 ?Covid vax -2 Phizer ?Chronic insomnia is better. ?She reports sleeping better. Still working hard, but less stress and life is going better for her. Asks temazepam refill- has been successful. ? ?09/08/21- 56 year old female never smoker from [place] 30 years ago followed for insomnia-situational stress ? -ambien 5, amitriptyline 25 ?Covid vax -2 Phizer ?Temazepam had helped sleep but was not being covered by insurance which preferred Lunesta and Ambien.  We had sent 5 mg Ambien but she wants to take a higher dose.  I discussed concerns about higher dose Ambien due to age and gender.  Instead she agrees to try Lunesta.  She continues amitriptyline. ?Her American father died at age 31 with dementia.  She asks for a "test" and I recommended she speak with her primary care provider about mental status exam if there is a question. ? ?ROS-see HPI + = positive ?Constitutional:   No-   weight loss, night sweats, fevers, chills,+fatigue, lassitude. ?HEENT:   No-  headaches, difficulty swallowing, tooth/dental problems, sore throat,  ?     No-  sneezing, itching, ear ache, nasal congestion, post nasal drip,  ?CV:  No-   chest pain, orthopnea, PND, swelling in lower extremities, anasarca,  dizziness, palpitations ?Resp: No-   shortness of breath with exertion or at rest.   ?           No-   productive cough,  No non-productive cough,  No- coughing up of blood.   ?           No-   change in color of mucus.  No- wheezing.   ?Skin: No-   rash or lesions. ?GI:  No-   heartburn, indigestion, abdominal pain, nausea, vomiting,  ?GU:  ?MS:  No-   joint pain or swelling.   ?Neuro-     nothing unusual ?Psych:   No- change in mood or affect. No depression + anxiety.  No memory loss. ? ?OBJ- Physical Exam ?General- Alert, Oriented, Affect-appropriate, Distress- none acute,  ?Skin- rash-none, lesions- none, excoriation- none ?Lymphadenopathy- none ?Head- atraumatic ?           Eyes- Gross vision intact, PERRLA, conjunctivae and secretions clear ?           Ears- Hearing, canals-normal ?           Nose- Clear, no-Septal dev, mucus, polyps, erosion, perforation  ?           Throat- Mallampati II-III , mucosa clear , drainage- none, tonsils- atrophic ?Neck- flexible , trachea midline, no stridor , thyroid nl, carotid no bruit ?Chest - symmetrical excursion , unlabored ?          Heart/CV- RRR , no murmur , no gallop  , no rub, nl s1 s2 ?                          - JVD- none , edema- none, stasis changes- none, varices- none ?          Lung- clear to P&A, wheeze- none, cough- none , dullness-none, rub- none ?          Chest wall-  ?Abd-  ?Br/ Gen/ Rectal- Not done, not indicated ?Extrem- cyanosis- none, clubbing, none,  atrophy- none, strength- nl ?Neuro- grossly intact to observation ? ? ? ?

## 2021-09-08 NOTE — Assessment & Plan Note (Signed)
Longstanding problem.  She understands that her chronic general anxiety underlies this.  We discussed sleep habits again. ?Plan-instead of increasing Ambien we are going to try Lunesta using 1 mg tabs so she can adjust her own dose up to maximum 3 mg for sleep. ?

## 2021-09-08 NOTE — Patient Instructions (Addendum)
Script sent to [facility] to try eszopiclone( Lunesta)-  up to 3 tabs at bedtime as needed for sleep ? ?Continue amitriptyline (Elavil) 1 daily at bedtime ? ?Please let us know how the Alfonso Patten works for you. We can refill it if helpful. ? ?Ok to keep the August appointment. ?

## 2021-09-12 ENCOUNTER — Telehealth: Payer: Self-pay | Admitting: Internal Medicine

## 2021-09-12 MED ORDER — ZOLPIDEM TARTRATE ER 12.5 MG PO TBCR: 12.5000 mg | EXTENDED_RELEASE_TABLET | Freq: Every evening | ORAL | 0 refills | Status: DC | PRN

## 2021-09-12 NOTE — Telephone Encounter (Signed)
Spoke to patient.  ?She would like to switching back to Ambien. Alfonso Patten is not effective. She is only getting 1-2hr sleep nightly.  ? ?Dr. Maple Hudson, pleaser advise. Thanks ?

## 2021-09-12 NOTE — Telephone Encounter (Signed)
Patient is aware below message/recommendations and voiced her understanding.  ?Nothing further needed.  ? ? ?

## 2021-09-12 NOTE — Telephone Encounter (Signed)
Lunesta dc'd and Rx sent for ambien cr 12.5 ?

## 2021-09-18 ENCOUNTER — Telehealth: Payer: Self-pay | Admitting: Internal Medicine

## 2021-09-18 MED ORDER — AMITRIPTYLINE HCL 10 MG PO TABS: ORAL_TABLET | ORAL | 5 refills | Status: AC

## 2021-09-18 NOTE — Telephone Encounter (Signed)
Yes, patient is okay with 1.5 tab of amitriptyline  ?

## 2021-09-18 NOTE — Telephone Encounter (Signed)
Patient is aware of message and voiced her understanding.  ?Nothing further needed.  ? ?

## 2021-09-18 NOTE — Telephone Encounter (Signed)
Amitriptyline refilled at her [facility] ?

## 2021-09-18 NOTE — Telephone Encounter (Signed)
Spoke to patient.  ?She would like to switch Ambien 12.5 to Amitriptyline 15mg . She has already switched to [facility] on [address].  ?She feels that Amitriptyline worked back for her.  ? ?Dr. Annamaria Boots, please advise. Thanks ? ?Current Outpatient Medications on File Prior to Visit  ?Medication Sig Dispense Refill  ? amitriptyline (ELAVIL) 10 MG tablet TAKE 2 TABLETS BY MOUTH AT BEDTIME AS NEEDED OR AS DIRECTED FOR SLEEP 60 tablet 5  ? zolpidem (AMBIEN CR) 12.5 MG CR tablet Take 1 tablet (12.5 mg total) by mouth at bedtime as needed for sleep. 30 tablet 0  ? ?No current facility-administered medications on file prior to visit.  ? ? ? ?No Known Allergies ? ?

## 2021-09-18 NOTE — Telephone Encounter (Signed)
They don't make a 15 mg amitriptyline.   We could use 10 mg tabs- 1 and a half tabs daily. Is that ok? ?

## 2021-09-19 ENCOUNTER — Telehealth: Payer: Self-pay | Admitting: Internal Medicine

## 2021-09-19 MED ORDER — TEMAZEPAM 15 MG PO CAPS: 15.0000 mg | ORAL_CAPSULE | Freq: Every evening | ORAL | 5 refills | Status: DC | PRN

## 2021-09-19 NOTE — Telephone Encounter (Signed)
Patient is wanting to go back on the temazepam 15MG  for sleep. Patient does not want the 7.5mg .  ? ?Call back number [phone] ? ?[facility] [address]. ?

## 2021-09-19 NOTE — Telephone Encounter (Signed)
Temazepam sent to drug store ?

## 2021-09-19 NOTE — Telephone Encounter (Signed)
Called and spoke with patient.  Patient made aware Dr. Maple Hudson sent Temazepam to requested pharmacy.  Nothing further at this time. ?

## 2021-09-19 NOTE — Telephone Encounter (Signed)
Called and spoke with patient.  Patient is requesting Temazepam 15mg  that she stated she was on before.  Patient stated she did not want a smaller 7.5mg  dose, because she would have to take more tablets for dose.   ?Patient stated she is not on Lunesta or Ambiem, but is taking Amitriptyline 10mg .  Patient would like to continue Amitriptyline.  Patient requested Temazepam 15mg  prescription to go to [facility]. ? ? ?Message routed to Dr. Annamaria Boots to advise ?

## 2021-09-20 ENCOUNTER — Telehealth: Payer: Self-pay | Admitting: Internal Medicine

## 2021-09-20 NOTE — Telephone Encounter (Signed)
Spoke to [employer] with [employer], who stated that this medication can not be transferred.  ? ?Dr. Annamaria Boots, please advise. Thanks ?

## 2021-09-20 NOTE — Telephone Encounter (Signed)
Could you please ask [facility] to redirect the script as she requests? ?

## 2021-09-20 NOTE — Telephone Encounter (Signed)
Spoke to patient. She would like Temazepam 15mg  sent to [facility] on [address]. ?Rx [facility] to [facility] [address].  ? ?Dr. Annamaria Boots, please advise. thanks ? ?

## 2021-09-20 NOTE — Telephone Encounter (Signed)
Patient is aware that we are currently awaiting response from Dr. Maple Hudson. She voiced her understanding.  ? ?

## 2021-09-20 NOTE — Telephone Encounter (Signed)
Spoke to patient and verified that Tamazepam 15mg  was sent to [facility] on 09/18/21.  ?She stated that would contact the pharmacy to ensure that they received Rx. She will call back if Rx has not been received.  ? ?

## 2021-09-20 NOTE — Telephone Encounter (Signed)
Patient checking on medication. Pharmacy is [facility]. Patient phone number is [phone]. ?

## 2021-09-21 MED ORDER — TEMAZEPAM 15 MG PO CAPS: 15.0000 mg | ORAL_CAPSULE | Freq: Every evening | ORAL | 5 refills | Status: AC | PRN

## 2021-09-21 NOTE — Telephone Encounter (Signed)
Called and spoke with patient. She is aware that [facility] has sent in the refill for her.  ? ?I called [facility] and was unable to speak to anyone. Will attempt to call back later.  ?

## 2021-09-21 NOTE — Telephone Encounter (Signed)
Patient is returning phone call. Patient phone number is [phone]. ?

## 2021-09-21 NOTE — Telephone Encounter (Signed)
Refill sent to [facility]).    Need to dc order at [facility]. ?

## 2021-09-21 NOTE — Telephone Encounter (Signed)
Called the pt and had to [facility]  ?Called [facility] [address] and was placed on hold for 9 min ?Will call back later  ? ?

## 2021-10-14 LAB — COLOGUARD: COLOGUARD: NEGATIVE

## 2021-11-08 ENCOUNTER — Ambulatory Visit: Payer: 59 | Admitting: Obstetrics and Gynecology

## 2022-01-29 ENCOUNTER — Ambulatory Visit: Payer: 59 | Admitting: Internal Medicine
